# Patient Record
Sex: Female | Born: 1935 | Race: White | Hispanic: No | Marital: Single | Smoking: Never smoker
Health system: Southern US, Community
[De-identification: ages and names within clinical notes are randomized; demographics above are authoritative.]

## PROBLEM LIST (undated history)

## (undated) DIAGNOSIS — F0390 Unspecified dementia without behavioral disturbance: Secondary | ICD-10-CM

## (undated) DIAGNOSIS — E222 Syndrome of inappropriate secretion of antidiuretic hormone: Secondary | ICD-10-CM

## (undated) DIAGNOSIS — G459 Transient cerebral ischemic attack, unspecified: Secondary | ICD-10-CM

## (undated) DIAGNOSIS — E785 Hyperlipidemia, unspecified: Secondary | ICD-10-CM

## (undated) DIAGNOSIS — I4891 Unspecified atrial fibrillation: Secondary | ICD-10-CM

## (undated) DIAGNOSIS — E119 Type 2 diabetes mellitus without complications: Secondary | ICD-10-CM

## (undated) HISTORY — PX: CATARACT EXTRACTION: SUR2

## (undated) HISTORY — PX: CHOLECYSTECTOMY: SHX55

---

## 2010-09-08 DIAGNOSIS — E222 Syndrome of inappropriate secretion of antidiuretic hormone: Secondary | ICD-10-CM

## 2010-09-08 HISTORY — DX: Syndrome of inappropriate secretion of antidiuretic hormone: E22.2

## 2010-09-15 ENCOUNTER — Inpatient Hospital Stay: Payer: Self-pay | Admitting: Internal Medicine

## 2016-10-03 ENCOUNTER — Emergency Department
Admission: EM | Admit: 2016-10-03 | Discharge: 2016-10-03 | Disposition: A | Discharge status: Home / Self Care | Payer: Medicare Other | Attending: Emergency Medicine | Admitting: Emergency Medicine

## 2016-10-03 ENCOUNTER — Encounter: Payer: Self-pay | Admitting: Emergency Medicine

## 2016-10-03 DIAGNOSIS — Z7984 Long term (current) use of oral hypoglycemic drugs: Secondary | ICD-10-CM | POA: Diagnosis not present

## 2016-10-03 DIAGNOSIS — R55 Syncope and collapse: Secondary | ICD-10-CM | POA: Insufficient documentation

## 2016-10-03 DIAGNOSIS — E119 Type 2 diabetes mellitus without complications: Secondary | ICD-10-CM | POA: Diagnosis not present

## 2016-10-03 DIAGNOSIS — N390 Urinary tract infection, site not specified: Secondary | ICD-10-CM | POA: Diagnosis not present

## 2016-10-03 HISTORY — DX: Transient cerebral ischemic attack, unspecified: G45.9

## 2016-10-03 HISTORY — DX: Unspecified atrial fibrillation: I48.91

## 2016-10-03 HISTORY — DX: Unspecified dementia, unspecified severity, without behavioral disturbance, psychotic disturbance, mood disturbance, and anxiety: F03.90

## 2016-10-03 HISTORY — DX: Type 2 diabetes mellitus without complications: E11.9

## 2016-10-03 HISTORY — DX: Hyperlipidemia, unspecified: E78.5

## 2016-10-03 LAB — BASIC METABOLIC PANEL
ANION GAP: 5 (ref 5–15)
BUN: 16 mg/dL (ref 6–20)
CO2: 27 mmol/L (ref 22–32)
Calcium: 8.9 mg/dL (ref 8.9–10.3)
Chloride: 101 mmol/L (ref 101–111)
Creatinine, Ser: 0.97 mg/dL (ref 0.44–1.00)
GFR calc Af Amer: 57 mL/min — ABNORMAL LOW (ref >60)
GFR calc non Af Amer: 49 mL/min — ABNORMAL LOW (ref >60)
GLUCOSE: 236 mg/dL — AB (ref 65–99)
Potassium: 5.4 mmol/L — ABNORMAL HIGH (ref 3.5–5.1)
Sodium: 133 mmol/L — ABNORMAL LOW (ref 135–145)

## 2016-10-03 LAB — CBC
HCT: 39.1 % (ref 35.0–47.0)
HEMOGLOBIN: 13.4 g/dL (ref 12.0–16.0)
MCH: 30.4 pg (ref 26.0–34.0)
MCHC: 34.2 g/dL (ref 32.0–36.0)
MCV: 89 fL (ref 80.0–100.0)
Platelets: 173 10*3/uL (ref 150–440)
RBC: 4.4 MIL/uL (ref 3.80–5.20)
RDW: 14.3 % (ref 11.5–14.5)
WBC: 7.8 10*3/uL (ref 3.6–11.0)

## 2016-10-03 LAB — URINALYSIS, COMPLETE (UACMP) WITH MICROSCOPIC
BILIRUBIN URINE: NEGATIVE
Glucose, UA: 50 mg/dL — AB
KETONES UR: NEGATIVE mg/dL
Nitrite: NEGATIVE
Protein, ur: NEGATIVE mg/dL
Specific Gravity, Urine: 1.01 (ref 1.005–1.030)
pH: 6 (ref 5.0–8.0)

## 2016-10-03 LAB — TROPONIN I: Troponin I: <0.03 ng/mL (ref <0.03)

## 2016-10-03 LAB — GLUCOSE, CAPILLARY: GLUCOSE-CAPILLARY: 223 mg/dL — AB (ref 65–99)

## 2016-10-03 MED ORDER — CEPHALEXIN 500 MG PO CAPS: 500.0000 mg | ORAL_CAPSULE | Freq: Two times a day (BID) | ORAL | 0 refills | Status: DC

## 2016-10-03 MED ORDER — SODIUM CHLORIDE 0.9 % IV BOLUS (SEPSIS)
1000.0000 mL | Freq: Once | INTRAVENOUS | Status: AC
  Administered 2016-10-03: 1000 mL via INTRAVENOUS

## 2016-10-03 MED ORDER — CEFTRIAXONE SODIUM-DEXTROSE 1-3.74 GM-% IV SOLR
1.0000 g | Freq: Once | INTRAVENOUS | Status: AC
  Administered 2016-10-03: 1 g via INTRAVENOUS
  Filled 2016-10-03: qty 50

## 2016-10-03 MED ORDER — CEFTRIAXONE SODIUM 1 G IJ SOLR: 1.0000 g | Freq: Once | INTRAMUSCULAR | Status: DC

## 2016-10-03 NOTE — Discharge Instructions (Signed)
Please drink plenty of fluids, take your antibiotic as prescribed for her to entire duration. Return to the emergency department for any further syncopal episodes ((passing out), fever, or any other symptom personally concerning to yourself.

## 2016-10-03 NOTE — ED Triage Notes (Signed)
[facility] for near syncopal episode. Per EMS patient stood up and became cool, pale, and clammy. Fire department reported blood pressure of 70/pal on their arrival. EMS reports blood pressures in the 120's-130's systolic. Patient currently in A. Fib with hx/o the same.

## 2016-10-03 NOTE — ED Notes (Signed)
Patient grandson states that patient was using the rest room, she became pale and diaphoretic. Patients lips turned blue, patient was breathing at the time. Blood pressure was 70/pal and CBG was 250. Patient was unresponsive for about 3-4 minutes. When patient started coming around she was very weak and kept saying that she did not feel well. Patient was unable to remember what happened. Patient has hx/o dementia.

## 2016-10-03 NOTE — ED Notes (Signed)
Assisted pt to ambulate to bathroom. Pt urinated and had lg bm.

## 2016-10-03 NOTE — ED Provider Notes (Signed)
[facility] Emergency Department Provider Note  Time seen: 10:50 AM  I have reviewed the triage vital signs and the nursing notes.   HISTORY  Chief Complaint Near Syncope    HPI Dana Griffith is a 83 y.o. female with a past medical history of atrial fibrillation, dementia, diabetes, hyperlipidemia, TIAs, who presents to the emergency department after a syncopal event. According to the grandson who is a paramedic, the patient was calling for him (patient lives at home with grandson). He went upstairs and found the patient to be diaphoretic on the toilet with blue lips. He helped lay her down, states she was not responsive for approximately 3 minutes. Then came back around and is now acting completely normal. He states the patient was on the toilet but it did not appear that she had used the bathroom. Patient has significant dementia and does not recall the event, cannot contribute to her history. She has no complaints at this time and is well-appearing.  Past Medical History:  Diagnosis Date  . Atrial fibrillation (HCC)   . Dementia   . Diabetes mellitus without complication (HCC)   . Hyperlipemia   . TIA (transient ischemic attack)     There are no active problems to display for this patient.   History reviewed. No pertinent surgical history.  Prior to Admission medications   Not on File    No Known Allergies  No family history on file.  Social History Social History  Substance Use Topics  . Smoking status: Never Smoker  . Smokeless tobacco: Never Used  . Alcohol use No    Review of Systems Constitutional: Negative for fever. Cardiovascular: Negative for chest pain. Respiratory: Negative for shortness of breath. Gastrointestinal: Negative for abdominal pain Genitourinary: Negative for dysuria.  Son states the patient urinates very frequently. Neurological: Negative for headache 10-point ROS otherwise  negative.  ____________________________________________   PHYSICAL EXAM:  VITAL SIGNS: ED Triage Vitals [10/03/16 1004]  Enc Vitals Group     BP 127/68     Pulse Rate 82     Resp 16     Temp 97.6 F (36.4 C)     Temp Source Oral     SpO2 98 %     Weight      Height      Head Circumference      Peak Flow      Pain Score      Pain Loc      Pain Edu?      Excl. in GC?     Constitutional: Alert. Well appearing and in no distress.Acting normal per grandson. Eyes: Normal exam ENT   Head: Normocephalic and atraumatic.   Mouth/Throat: Mucous membranes are moist. Cardiovascular: Normal rate, regular rhythm. No murmur Respiratory: Normal respiratory effort without tachypnea nor retractions. Breath sounds are clear  Gastrointestinal: Soft and nontender. No distention.  Musculoskeletal: Nontender with normal range of motion in all extremities. No lower extremity tenderness or edema. Neurologic:  Normal speech and language. No gross focal neurologic deficits. Moves all extremities. Equal grip strengths. No facial droop or cranial nerve deficit. Skin:  Skin is warm, dry and intact.  Psychiatric: Mood and affect are normal.   ____________________________________________    EKG  EKG reviewed and interpreted by myself shows atrial fibrillation at 80 bpm. Borderline widened QRS, normal axis, normal intervals with no concerning ST changes.  ____________________________________________     INITIAL IMPRESSION / ASSESSMENT AND PLAN / ED COURSE  Pertinent  labs & imaging results that were available during my care of the patient were reviewed by me and considered in my medical decision making (see chart for details).  Patient presents to the emergency department after a syncopal episode while on the toilet. Grandson states diaphoresis and unresponsiveness. Patient came back around was noted to have a blood pressure in the 70s per grandson, both on the patient arrives to the  emergency department she has a normal blood pressure, she is alert and acting herself. Has no complaints at this time. EKG shows no concerning findings. We will check labs and closely monitor in the emergency department.  Patient's labs consistent with urinary tract infection, otherwise nonrevealing. Normal white blood cell count. I discussed the plan of care with family and they are agreeable. We'll discharge with Keflex. A urine culture has been sent. ____________________________________________   FINAL CLINICAL IMPRESSION(S) / ED DIAGNOSES  Syncope Urinary tract infection   Minna AntisKevin Cross Jorge, MD 10/03/16 1432

## 2016-10-05 LAB — URINE CULTURE

## 2017-06-01 ENCOUNTER — Emergency Department: Payer: Medicare Other

## 2017-06-01 ENCOUNTER — Encounter: Payer: Self-pay | Admitting: Emergency Medicine

## 2017-06-01 ENCOUNTER — Emergency Department
Admission: EM | Admit: 2017-06-01 | Discharge: 2017-06-01 | Disposition: A | Discharge status: Home / Self Care | Payer: Medicare Other | Attending: Student in an Organized Health Care Education/Training Program | Admitting: Student in an Organized Health Care Education/Training Program

## 2017-06-01 DIAGNOSIS — W07XXXA Fall from chair, initial encounter: Secondary | ICD-10-CM | POA: Diagnosis not present

## 2017-06-01 DIAGNOSIS — Y939 Activity, unspecified: Secondary | ICD-10-CM | POA: Insufficient documentation

## 2017-06-01 DIAGNOSIS — Z79899 Other long term (current) drug therapy: Secondary | ICD-10-CM | POA: Diagnosis not present

## 2017-06-01 DIAGNOSIS — Z794 Long term (current) use of insulin: Secondary | ICD-10-CM | POA: Diagnosis not present

## 2017-06-01 DIAGNOSIS — E119 Type 2 diabetes mellitus without complications: Secondary | ICD-10-CM | POA: Insufficient documentation

## 2017-06-01 DIAGNOSIS — F0390 Unspecified dementia without behavioral disturbance: Secondary | ICD-10-CM | POA: Insufficient documentation

## 2017-06-01 DIAGNOSIS — Y999 Unspecified external cause status: Secondary | ICD-10-CM | POA: Diagnosis not present

## 2017-06-01 DIAGNOSIS — S52592A Other fractures of lower end of left radius, initial encounter for closed fracture: Secondary | ICD-10-CM | POA: Diagnosis not present

## 2017-06-01 DIAGNOSIS — Y929 Unspecified place or not applicable: Secondary | ICD-10-CM | POA: Insufficient documentation

## 2017-06-01 DIAGNOSIS — Z8673 Personal history of transient ischemic attack (TIA), and cerebral infarction without residual deficits: Secondary | ICD-10-CM | POA: Insufficient documentation

## 2017-06-01 DIAGNOSIS — S6992XA Unspecified injury of left wrist, hand and finger(s), initial encounter: Secondary | ICD-10-CM | POA: Diagnosis present

## 2017-06-01 MED ORDER — HYDROCODONE-ACETAMINOPHEN 5-325 MG PO TABS: 1.0000 | ORAL_TABLET | Freq: Four times a day (QID) | ORAL | 0 refills | Status: AC | PRN

## 2017-06-01 NOTE — ED Provider Notes (Signed)
[facility] Emergency Department Provider Note  ____________________________________________  Time seen: Approximately 3:39 PM  I have reviewed the triage vital signs and the nursing notes.   HISTORY  Chief Complaint Wrist Pain  Patient has dementia and daughter is guardian and present with patient. Majority of history is provided by daughter  HPI CATLYNN Griffith is a 83 y.o. female who presents to emergency department with her daughter for complaint of left wrist injury. Per the daughter, the patient was attempting to sit in a chair when she slipped and landed on left wrist. Patient has been complaining of pain and swelling 2 days. Initially, family thought patient sprained wrist with concern as worse as been edematous, with ecchymosis. No other injury or complaint.patient did not hit head or lose consciousness during the fall. Patient has been acting her baseline since injury. Patient reports that pain is relieved with Tylenol. No other injury or complaint at this time.   Past Medical History:  Diagnosis Date  . Atrial fibrillation (HCC)   . Dementia   . Diabetes mellitus without complication (HCC)   . Hyperlipemia   . TIA (transient ischemic attack)     There are no active problems to display for this patient.   History reviewed. No pertinent surgical history.  Prior to Admission medications   Medication Sig Start Date End Date Taking? Authorizing Provider  atorvastatin (LIPITOR) 20 MG tablet Take 20 mg by mouth daily. 09/24/16   [provider]  cephALEXin (KEFLEX) 500 MG capsule Take 1 capsule (500 mg total) by mouth 2 (two) times daily. 10/03/16   Minna Antis, MD  digoxin (LANOXIN) 0.125 MG tablet Take 125 mcg by mouth daily. 09/23/16   [provider]  diltiazem (CARDIZEM SR) 60 MG 12 hr capsule Take 60 mg by mouth 2 (two) times daily. 09/23/16   [provider]  HUMULIN R 100 UNIT/ML injection Inject 8 Units into  the skin 3 (three) times daily. 09/28/16   [provider]  HYDROcodone-acetaminophen (NORCO/VICODIN) 5-325 MG tablet Take 1 tablet by mouth every 6 (six) hours as needed for severe pain. 06/01/17   Latasha Puskas, Delorise Royals, PA-C  LANTUS SOLOSTAR 100 UNIT/ML Solostar Pen Inject 30 Units into the skin at bedtime. 09/19/16   [provider]  lisinopril (PRINIVIL,ZESTRIL) 10 MG tablet Take 10 mg by mouth daily. 09/23/16   [provider]  metoCLOPramide (REGLAN) 5 MG tablet Take 1 tablet by mouth 3 (three) times daily after meals. 09/20/16   [provider]  nitroGLYCERIN (NITROSTAT) 0.4 MG SL tablet Place 1 tablet under the tongue every 5 (five) minutes x 3 doses as needed. 09/24/16   [provider]  ondansetron (ZOFRAN-ODT) 4 MG disintegrating tablet Take 4 mg by mouth every 6 (six) hours. 09/27/16   [provider]    Allergies Niaspan [niacin er]  No family history on file.  Social History Social History  Substance Use Topics  . Smoking status: Never Smoker  . Smokeless tobacco: Never Used  . Alcohol use No     Review of Systems  Constitutional: No fever/chills Eyes: No visual changes.  Cardiovascular: no chest pain. Respiratory: no cough. No SOB. Gastrointestinal: No abdominal pain.  No nausea, no vomiting.   Musculoskeletal: positive for left wrist injury. Skin: Negative for rash, abrasions, lacerations, ecchymosis. Neurological: Negative for headaches, focal weakness or numbness. 10-point ROS otherwise negative.  ____________________________________________   PHYSICAL EXAM:  VITAL SIGNS: ED Triage Vitals [06/01/17 1438]  Enc Vitals  Group     BP (!) 102/57     Pulse Rate 99     Resp 18     Temp 98.3 F (36.8 C)     Temp Source Oral     SpO2 94 %     Weight 105 lb (47.6 kg)     Height  (1.6 m)     Head Circumference      Peak Flow      Pain Score      Pain Loc      Pain Edu?      Excl. in GC?       Constitutional: Alert and oriented. Well appearing and in no acute distress. Eyes: Conjunctivae are normal. PERRL. EOMI. Head: Atraumatic. ENT:      Ears:       Nose: No congestion/rhinnorhea.      Mouth/Throat: Mucous membranes are moist.  Neck: No stridor.  No cervical spine tenderness to palpation.  Cardiovascular: Normal rate, regular rhythm. Normal S1 and S2.  Good peripheral circulation. Respiratory: Normal respiratory effort without tachypnea or retractions. Lungs CTAB. Good air entry to the bases with no decreased or absent breath sounds. Musculoskeletal: Full range of motion to all extremities. No gross deformities appreciated.no deformity noted to left wrist. 2. Ecchymosis and edema is noted of the distal radius and ulna. Patient is tender to palpation of the distal radius with no palpable abnormality. Good range of motion to the wrist and all digits left hand. Cap refill and sensation intact all 5 digits. Examination of the elbow is unremarkable. Neurologic:  Normal speech and language. No gross focal neurologic deficits are appreciated.  Skin:  Skin is warm, dry and intact. No rash noted. Psychiatric: Mood and affect are normal. Speech and behavior are normal. Patient exhibits appropriate insight and judgement.   ____________________________________________   LABS (all labs ordered are listed, but only abnormal results are displayed)  Labs Reviewed - No data to display ____________________________________________  EKG   ____________________________________________  RADIOLOGY Festus [place], personally viewed and evaluated these images (plain radiographs) as part of my medical decision making, as well as reviewing the written report by the radiologist.  Dg Wrist Complete Left  Result Date: 06/01/2017 CLINICAL DATA:  83 year old who was found the sleep on the floor next to her rocking chair 2 days ago. Erythema and swelling involving the left wrist. No  known injury. EXAM: LEFT WRIST - COMPLETE 3+ VIEW COMPARISON:  None. FINDINGS: Acute impacted fracture involving the distal radius with extension to the articular surface. Anatomic alignment of the radiocarpal joint. Joint spaces well-preserved within the carpus. Severe narrowing of the first MTP joint space with associated hypertrophic spurring. Osseous demineralization. IMPRESSION: 1. Acute impacted intra-articular fracture involving the distal radius. 2. 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 Carpal joint spaces are well preserved. 3. Osseous demineralization. Electronically Signed   By: Hulan Saas M.D.   On: 06/01/2017 15:26    ____________________________________________    PROCEDURES  Procedure(s) performed:    .Splint Application Date/Time: 06/01/2017 4:28 PM Performed by: Gala Romney D Authorized by: Gala Romney D   Consent:    Consent obtained:  Verbal   Consent given by:  Patient and guardian   Risks discussed:  Pain and swelling Pre-procedure details:    Sensation:  Normal Procedure details:    Laterality:  Left   Location:  Wrist   Wrist:  L wrist   Splint type:  Sugar tong   Supplies:  Cotton padding, Ortho-Glass, elastic bandage and sling Post-procedure details:    Pain:  Improved   Sensation:  Normal   Patient tolerance of procedure:  Tolerated well, no immediate complications      Medications - No data to display   ____________________________________________   INITIAL IMPRESSION / ASSESSMENT AND PLAN / ED COURSE  Pertinent labs & imaging results that were available during my care of the patient were reviewed by me and considered in my medical decision making (see chart for details).  Review of the Standard CSRS was performed in accordance of the [facility] prior to dispensing any controlled drugs.     Patient's diagnosis is consistent with left distal radius fracture. Patient presented status post a fall onto the left wrist  with pain and swelling. X-ray reveals impacted distal radius fracture. Patient is neurovascularly intact. Wrist is splinted as described above. Limit pain medication as prescribed and to be taken only with daughter present. Patient will follow-up with orthopedics for further management..  Patient is given ED precautions to return to the ED for any worsening or new symptoms.     ____________________________________________  FINAL CLINICAL IMPRESSION(S) / ED DIAGNOSES  Final diagnoses:  Other closed fracture of distal end of left radius, initial encounter      NEW MEDICATIONS STARTED DURING THIS VISIT:  New Prescriptions   HYDROCODONE-ACETAMINOPHEN (NORCO/VICODIN) 5-325 MG TABLET    Take 1 tablet by mouth every 6 (six) hours as needed for severe pain.        This chart was dictated using voice recognition software/Dragon. Despite best efforts to proofread, errors can occur which can change the meaning. Any change was purely unintentional.    Racheal Patches, PA-C 06/01/17 1629    Merrily Brittle, MD 06/01/17 [phone]

## 2017-06-01 NOTE — ED Triage Notes (Signed)
Patient comes is with daughter who states she had an unwitnessed fall 2 days ago. Redness L wrist. Daughter states it looked like she had tried to sit on chair and chair moved out from under her. Patient has dementia and per her daughter is her normal self. Patient is verbal. Denies pain when questioned. Daughter states checked scalp after fall and not swollen areas indicating had hit head.

## 2017-07-17 ENCOUNTER — Other Ambulatory Visit: Payer: Self-pay

## 2017-07-17 ENCOUNTER — Encounter: Payer: Self-pay | Admitting: Emergency Medicine

## 2017-07-17 ENCOUNTER — Emergency Department: Payer: Medicare Other

## 2017-07-17 ENCOUNTER — Emergency Department
Admission: EM | Admit: 2017-07-17 | Discharge: 2017-07-17 | Disposition: A | Discharge status: Still In Hospital | Payer: Medicare Other

## 2017-07-17 ENCOUNTER — Emergency Department
Admission: EM | Admit: 2017-07-17 | Discharge: 2017-07-17 | Disposition: A | Discharge status: Home / Self Care | Payer: Medicare Other | Attending: Student in an Organized Health Care Education/Training Program | Admitting: Student in an Organized Health Care Education/Training Program

## 2017-07-17 DIAGNOSIS — N3000 Acute cystitis without hematuria: Secondary | ICD-10-CM | POA: Insufficient documentation

## 2017-07-17 DIAGNOSIS — Z794 Long term (current) use of insulin: Secondary | ICD-10-CM | POA: Insufficient documentation

## 2017-07-17 DIAGNOSIS — R55 Syncope and collapse: Secondary | ICD-10-CM | POA: Diagnosis present

## 2017-07-17 DIAGNOSIS — F0390 Unspecified dementia without behavioral disturbance: Secondary | ICD-10-CM | POA: Diagnosis not present

## 2017-07-17 DIAGNOSIS — Z7982 Long term (current) use of aspirin: Secondary | ICD-10-CM | POA: Diagnosis not present

## 2017-07-17 DIAGNOSIS — I10 Essential (primary) hypertension: Secondary | ICD-10-CM | POA: Diagnosis not present

## 2017-07-17 DIAGNOSIS — Z79899 Other long term (current) drug therapy: Secondary | ICD-10-CM | POA: Diagnosis not present

## 2017-07-17 DIAGNOSIS — E119 Type 2 diabetes mellitus without complications: Secondary | ICD-10-CM | POA: Insufficient documentation

## 2017-07-17 LAB — URINALYSIS, COMPLETE (UACMP) WITH MICROSCOPIC
Bilirubin Urine: NEGATIVE
GLUCOSE, UA: NEGATIVE mg/dL
HGB URINE DIPSTICK: NEGATIVE
KETONES UR: NEGATIVE mg/dL
NITRITE: POSITIVE — AB
PH: 6 (ref 5.0–8.0)
PROTEIN: NEGATIVE mg/dL
SQUAMOUS EPITHELIAL / LPF: NONE SEEN
Specific Gravity, Urine: 1.01 (ref 1.005–1.030)

## 2017-07-17 LAB — BASIC METABOLIC PANEL
Anion gap: 11 (ref 5–15)
BUN: 13 mg/dL (ref 6–20)
CO2: 24 mmol/L (ref 22–32)
Calcium: 8.7 mg/dL — ABNORMAL LOW (ref 8.9–10.3)
Chloride: 95 mmol/L — ABNORMAL LOW (ref 101–111)
Creatinine, Ser: 0.88 mg/dL (ref 0.44–1.00)
GFR calc Af Amer: >60 mL/min (ref >60)
GFR, EST NON AFRICAN AMERICAN: 55 mL/min — AB (ref >60)
GLUCOSE: 233 mg/dL — AB (ref 65–99)
Potassium: 5 mmol/L (ref 3.5–5.1)
SODIUM: 130 mmol/L — AB (ref 135–145)

## 2017-07-17 LAB — TROPONIN I: Troponin I: <0.03 ng/mL (ref <0.03)

## 2017-07-17 LAB — CBC
HCT: 39.5 % (ref 35.0–47.0)
Hemoglobin: 13.1 g/dL (ref 12.0–16.0)
MCH: 30.3 pg (ref 26.0–34.0)
MCHC: 33.2 g/dL (ref 32.0–36.0)
MCV: 91.3 fL (ref 80.0–100.0)
PLATELETS: 163 10*3/uL (ref 150–440)
RBC: 4.32 MIL/uL (ref 3.80–5.20)
RDW: 13.5 % (ref 11.5–14.5)
WBC: 11.7 10*3/uL — AB (ref 3.6–11.0)

## 2017-07-17 LAB — GLUCOSE, CAPILLARY: GLUCOSE-CAPILLARY: 236 mg/dL — AB (ref 65–99)

## 2017-07-17 MED ORDER — CEPHALEXIN 500 MG PO CAPS
500.0000 mg | ORAL_CAPSULE | Freq: Once | ORAL | Status: AC
Start: 2017-07-17 — End: 2017-07-17
  Administered 2017-07-17: 500 mg via ORAL
  Filled 2017-07-17: qty 1

## 2017-07-17 MED ORDER — DILTIAZEM HCL 30 MG PO TABS
30.0000 mg | ORAL_TABLET | Freq: Four times a day (QID) | ORAL | Status: DC
  Administered 2017-07-17: 30 mg via ORAL
  Filled 2017-07-17 (×3): qty 1

## 2017-07-17 MED ORDER — DILTIAZEM HCL ER 60 MG PO CP12
60.0000 mg | ORAL_CAPSULE | Freq: Two times a day (BID) | ORAL | Status: DC
  Filled 2017-07-17: qty 1

## 2017-07-17 MED ORDER — CEPHALEXIN 500 MG PO CAPS: 500.0000 mg | ORAL_CAPSULE | Freq: Three times a day (TID) | ORAL | 0 refills | Status: AC

## 2017-07-17 MED ORDER — SODIUM CHLORIDE 0.9 % IV BOLUS (SEPSIS)
500.0000 mL | Freq: Once | INTRAVENOUS | Status: AC
  Administered 2017-07-17: 500 mL via INTRAVENOUS

## 2017-07-17 NOTE — Progress Notes (Signed)
LCSW met with patient and her daughter who is her legal guardian. Patient has excellent family support and will return to family home. Several family members rally around this patient to provided excellent in home support. LCSW offered dementia services  brochures but her daughter stated she is very well connected already. She did request a  diet coke and LCSW got it for her she had no further questions and LCSW collected information to complete brief assessment. EDP consulted and LCSW explained the great family involvement to see to her care at home.  No further needs  [employer] LCSW [phone]

## 2017-07-17 NOTE — ED Notes (Signed)
Pt family declines for pt to have in and out cath at this time.

## 2017-07-17 NOTE — ED Triage Notes (Addendum)
Pt to ED via [facility] from home for syncopal episode after having large BM and taking a shower. Pt daughter states that she has had episodes like this in the past. Per EMS pt was hypotensive on their arrival with a systolic pressure in the 80's. Pt in NAD at this time.

## 2017-07-17 NOTE — ED Provider Notes (Signed)
[facility] Emergency Department Provider Note    First MD Initiated Contact with Patient 07/17/17 1536     (approximate)  I have reviewed the triage vital signs and the nursing notes.   HISTORY  Chief Complaint Loss of Consciousness    HPI Dana Griffith is a 83 y.o. female with a history of atrial fibrillation as well as TIA, diabetes and history of recurrent vasovagal syncopal episodes presents after witnessed syncopal episode.  Patient was at daughter's home and went to use the bathroom.  Had a bowel movement and when the daughter went to check on her she noted that the patient started looking like she was about to pass out and become unresponsive.  She did not fall and hit her head.  She was laid to the ground.  Her remained lightheaded and less responsive for several months minutes.  When EMS came to assess her she was profoundly hypotensive which slowly improved without any IV fluids.  Daughter states that she is get frequent urinary tract infections.  Has not been complaining of any abdominal pain, nausea, chest pain, shortness of breath, fevers, discomfort.  Past Medical History:  Diagnosis Date  . Atrial fibrillation (HCC)   . Dementia   . Diabetes mellitus without complication (HCC)   . Hyperlipemia   . TIA (transient ischemic attack)    No family history on file. History reviewed. No pertinent surgical history. There are no active problems to display for this patient.     Prior to Admission medications   Medication Sig Start Date End Date Taking? Authorizing Provider  aspirin EC 81 MG tablet Take 81 mg 2 (two) times daily by mouth.   Yes [provider]  atorvastatin (LIPITOR) 20 MG tablet Take 20 mg by mouth daily. 09/24/16  Yes [provider]  Cholecalciferol (VITAMIN D3) 2000 units TABS Take 1 tablet daily by mouth.   Yes [provider]  digoxin (LANOXIN) 0.125 MG tablet Take 125 mcg by mouth daily. 09/23/16   Yes [provider]  diltiazem (CARDIZEM SR) 60 MG 12 hr capsule Take 60 mg by mouth 2 (two) times daily. 09/23/16  Yes [provider]  escitalopram (LEXAPRO) 10 MG tablet Take 10 mg daily by mouth.   Yes [provider]  fluticasone (FLONASE) 50 MCG/ACT nasal spray Place 1 spray 2 (two) times daily into both nostrils.   Yes [provider]  HUMULIN R 100 UNIT/ML injection Inject 8 Units 4 (four) times daily as needed into the skin.  09/28/16  Yes [provider]  isosorbide mononitrate (IMDUR) 30 MG 24 hr tablet Take 30 mg daily by mouth.   Yes [provider]  lisinopril (PRINIVIL,ZESTRIL) 10 MG tablet Take 10 mg by mouth daily. 09/23/16  Yes [provider]  metoCLOPramide (REGLAN) 5 MG tablet Take 1 tablet 2 (two) times daily by mouth.  09/20/16  Yes [provider]  Multiple Vitamin (MULTIVITAMIN) tablet Take 1 tablet daily by mouth.   Yes [provider]  omeprazole (PRILOSEC) 40 MG capsule Take 40 mg daily by mouth.   Yes [provider]  sodium chloride 1 g tablet Take 1 g 2 (two) times daily with a meal by mouth.   Yes [provider]  cephALEXin (KEFLEX) 500 MG capsule Take 1 capsule (500 mg total) by mouth 2 (two) times daily. Patient not taking: Reported on 07/17/2017 10/03/16   Minna AntisPaduchowski, Kevin, MD  cephALEXin (KEFLEX) 500 MG capsule Take 1  capsule (500 mg total) 3 (three) times daily for 7 days by mouth. 07/17/17 07/24/17  Willy Eddy, MD  HYDROcodone-acetaminophen (NORCO/VICODIN) 5-325 MG tablet Take 1 tablet by mouth every 6 (six) hours as needed for severe pain. 06/01/17   Cuthriell, Delorise Royals, PA-C  nitroGLYCERIN (NITROSTAT) 0.4 MG SL tablet Place 1 tablet under the tongue every 5 (five) minutes x 3 doses as needed. 09/24/16   [provider]  ondansetron (ZOFRAN-ODT) 4 MG disintegrating tablet Take 4 mg by mouth every 6 (six) hours. 09/27/16   [provider]     Allergies Niaspan [niacin er]    Social History Social History   Tobacco Use  . Smoking status: Never Smoker  . Smokeless tobacco: Never Used  Substance Use Topics  . Alcohol use: No  . Drug use: Not on file    Review of Systems Patient denies headaches, rhinorrhea, blurry vision, numbness, shortness of breath, chest pain, edema, cough, abdominal pain, nausea, vomiting, diarrhea, dysuria, fevers, rashes or hallucinations unless otherwise stated above in HPI. ____________________________________________   PHYSICAL EXAM:  VITAL SIGNS: Vitals:   07/17/17 1930 07/17/17 1940  BP: 111/67   Pulse:    Resp:  18  Temp:    SpO2:      Constitutional: Alert. Frail and elderly appearing but in no acute distress. Eyes: Conjunctivae are normal.  Head: Atraumatic. Nose: No congestion/rhinnorhea. Mouth/Throat: Mucous membranes are moist.   Neck: No stridor. Painless ROM.  Cardiovascular: irregularly irregular rhythm. Grossly normal heart sounds.  Good peripheral circulation. Respiratory: Normal respiratory effort.  No retractions. Lungs CTAB. Gastrointestinal: Soft and nontender. No distention. No abdominal bruits. No CVA tenderness. Genitourinary:  Musculoskeletal: No lower extremity tenderness nor edema.  No joint effusions. Neurologic:  Normal speech and language. No gross focal neurologic deficits are appreciated. No facial droop Skin:  Skin is warm, dry and intact. No rash noted. Psychiatric: Mood and affect are normal. Speech and behavior are normal.  ____________________________________________   LABS (all labs ordered are listed, but only abnormal results are displayed)  Results for orders placed or performed during the hospital encounter of 07/17/17 (from the past 24 hour(s))  Basic metabolic panel     Status: Abnormal   Collection Time: 07/17/17  3:47 PM  Result Value Ref Range   Sodium 130 (L) 135 - 145 mmol/L   Potassium 5.0 3.5 - 5.1 mmol/L   Chloride 95  (L) 101 - 111 mmol/L   CO2 24 22 - 32 mmol/L   Glucose, Bld 233 (H) 65 - 99 mg/dL   BUN 13 6 - 20 mg/dL   Creatinine, Ser 1.61 0.44 - 1.00 mg/dL   Calcium 8.7 (L) 8.9 - 10.3 mg/dL   GFR calc non Af Amer 55 (L) >60 mL/min   GFR calc Af Amer >60 >60 mL/min   Anion gap 11 5 - 15  CBC     Status: Abnormal   Collection Time: 07/17/17  3:47 PM  Result Value Ref Range   WBC 11.7 (H) 3.6 - 11.0 K/uL   RBC 4.32 3.80 - 5.20 MIL/uL   Hemoglobin 13.1 12.0 - 16.0 g/dL   HCT 09.6 04.5 - 40.9 %   MCV 91.3 80.0 - 100.0 fL   MCH 30.3 26.0 - 34.0 pg   MCHC 33.2 32.0 - 36.0 g/dL   RDW 81.1 91.4 - 78.2 %   Platelets 163 150 - 440 K/uL  Troponin I     Status: None   Collection Time: 07/17/17  3:47 PM  Result Value Ref Range   Troponin I <0.03 <0.03 ng/mL  Urinalysis, Complete w Microscopic     Status: Abnormal   Collection Time: 07/17/17  7:29 PM  Result Value Ref Range   Color, Urine YELLOW (A) YELLOW   APPearance HAZY (A) CLEAR   Specific Gravity, Urine 1.010 1.005 - 1.030   pH 6.0 5.0 - 8.0   Glucose, UA NEGATIVE NEGATIVE mg/dL   Hgb urine dipstick NEGATIVE NEGATIVE   Bilirubin Urine NEGATIVE NEGATIVE   Ketones, ur NEGATIVE NEGATIVE mg/dL   Protein, ur NEGATIVE NEGATIVE mg/dL   Nitrite POSITIVE (A) NEGATIVE   Leukocytes, UA LARGE (A) NEGATIVE   RBC / HPF 0-5 0 - 5 RBC/hpf   WBC, UA TOO NUMEROUS TO COUNT 0 - 5 WBC/hpf   Bacteria, UA FEW (A) NONE SEEN   Squamous Epithelial / LPF NONE SEEN NONE SEEN   ____________________________________________  EKG My review and personal interpretation at Time: 15:37   Indication: syncope  Rate: 55  Rhythm afib rate controlled Axis: normal Other: no stemi, otherwise normal intervals ____________________________________________  RADIOLOGY  I personally reviewed all radiographic images ordered to evaluate for the above acute complaints and reviewed radiology reports and findings.  These findings were personally discussed with the patient.  Please see  medical record for radiology report.  ____________________________________________   PROCEDURES  Procedure(s) performed:  Procedures    Critical Care performed: no ____________________________________________   INITIAL IMPRESSION / ASSESSMENT AND PLAN / ED COURSE  Pertinent labs & imaging results that were available during my care of the patient were reviewed by me and considered in my medical decision making (see chart for details).  DDX: dysrhthmia, acs, pna, chf, vasovagal, uti, sepsis  Dana MansonMargaret H Griffith is a 83 y.o. who presents to the ED with near syncopal episode as described above.  Patient has history of the same.  Currently in A. fib but is currently well controlled.  No evidence of head injury by physical exam or history.  We will check blood work for the above differential.  Do suspect component of UTI she has had this in the past.  Will evaluate for any evidence of dysrhythmia by keeping the patient on a monitor her for ACS.  Have discussed with the patient and available family all diagnostics and treatments performed thus far and all questions were answered to the best of my ability. The patient demonstrates understanding and agreement with plan.   Clinical Course as of Jul 17 2334  Caleen EssexFri Jul 17, 2017  [id number] Patient remains well-appearing and in no acute distress.  Given gentle bolus of IV fluids with improvement in her blood pressure.  Currently awaiting urinalysis that she does have mild leukocytosis and she had similar episode of this previously related to UTI.  [PR]  1956 Patient reassessed.  Ambulating with a steady gait.  Discussed with patient's family who is a EMT and well-known to this facility who states that she is at her baseline.  He is a very reliable historian and does feel comfortable taking care of her at home.  They have dealt with this in the past including syncopal episodes and urinary tract infection.  Based on her age I did offer admission to the hospital  for IV antibiotics and observation overnight but they have prefer to take her home due to her dementia and concern for inciting agitation or worsening confusion.  As she does not have any fever evidence of sepsis I do believe that  this is reasonable.  Patient was given an oral dose of Keflex here in the ER and will be stable and appropriate for follow-up with PCP.  [PR]    Clinical Course User Index [PR] Willy Eddy, MD     ____________________________________________   FINAL CLINICAL IMPRESSION(S) / ED DIAGNOSES  Final diagnoses:  Near syncope  Acute cystitis without hematuria      NEW MEDICATIONS STARTED DURING THIS VISIT:  This SmartLink is deprecated. Use AVSMEDLIST instead to display the medication list for a patient.   Note:  This document was prepared using Dragon voice recognition software and may include unintentional dictation errors.    Willy Eddy, MD 07/17/17 [phone]

## 2017-07-17 NOTE — ED Notes (Addendum)
Attempted to get urine with hat. Pt had a BM in sample. MD notified. Pt ambulated with her baseline gait per family.

## 2017-07-17 NOTE — ED Triage Notes (Signed)
Pt to ED via [facility] from home for syncopal episode after having large BM and taking a shower. Pt daughter states that she has had episodes like this in the past. Per EMS pt was hypotensive on their arrival with a systolic pressure in the 80's. Pt in NAD at this time.  

## 2018-06-12 ENCOUNTER — Encounter: Payer: Self-pay | Admitting: Emergency Medicine

## 2018-06-12 ENCOUNTER — Inpatient Hospital Stay
Admission: EM | Admit: 2018-06-12 | Discharge: 2018-06-12 | DRG: 690 | Disposition: A | Discharge status: Home / Self Care | Payer: Medicare Other | Attending: Emergency Medicine | Admitting: Emergency Medicine

## 2018-06-12 ENCOUNTER — Other Ambulatory Visit: Payer: Self-pay

## 2018-06-12 DIAGNOSIS — F0390 Unspecified dementia without behavioral disturbance: Secondary | ICD-10-CM | POA: Diagnosis not present

## 2018-06-12 DIAGNOSIS — Z792 Long term (current) use of antibiotics: Secondary | ICD-10-CM | POA: Diagnosis not present

## 2018-06-12 DIAGNOSIS — E119 Type 2 diabetes mellitus without complications: Secondary | ICD-10-CM | POA: Diagnosis not present

## 2018-06-12 DIAGNOSIS — I4510 Unspecified right bundle-branch block: Secondary | ICD-10-CM | POA: Diagnosis present

## 2018-06-12 DIAGNOSIS — N3000 Acute cystitis without hematuria: Secondary | ICD-10-CM | POA: Diagnosis present

## 2018-06-12 DIAGNOSIS — R4182 Altered mental status, unspecified: Secondary | ICD-10-CM | POA: Diagnosis not present

## 2018-06-12 DIAGNOSIS — N289 Disorder of kidney and ureter, unspecified: Secondary | ICD-10-CM

## 2018-06-12 DIAGNOSIS — I4891 Unspecified atrial fibrillation: Secondary | ICD-10-CM | POA: Diagnosis present

## 2018-06-12 DIAGNOSIS — I959 Hypotension, unspecified: Secondary | ICD-10-CM | POA: Diagnosis present

## 2018-06-12 DIAGNOSIS — Z8673 Personal history of transient ischemic attack (TIA), and cerebral infarction without residual deficits: Secondary | ICD-10-CM | POA: Diagnosis not present

## 2018-06-12 LAB — CBC
HEMATOCRIT: 42.8 % (ref 35.0–47.0)
HEMOGLOBIN: 14.2 g/dL (ref 12.0–16.0)
MCH: 30.3 pg (ref 26.0–34.0)
MCHC: 33.2 g/dL (ref 32.0–36.0)
MCV: 91.2 fL (ref 80.0–100.0)
Platelets: 196 10*3/uL (ref 150–440)
RBC: 4.69 MIL/uL (ref 3.80–5.20)
RDW: 13.8 % (ref 11.5–14.5)
WBC: 10.5 10*3/uL (ref 3.6–11.0)

## 2018-06-12 LAB — URINALYSIS, COMPLETE (UACMP) WITH MICROSCOPIC
BILIRUBIN URINE: NEGATIVE
GLUCOSE, UA: NEGATIVE mg/dL
HGB URINE DIPSTICK: NEGATIVE
KETONES UR: NEGATIVE mg/dL
NITRITE: POSITIVE — AB
PH: 5 (ref 5.0–8.0)
PROTEIN: NEGATIVE mg/dL
Specific Gravity, Urine: 1.014 (ref 1.005–1.030)

## 2018-06-12 LAB — COMPREHENSIVE METABOLIC PANEL
ALBUMIN: 4.2 g/dL (ref 3.5–5.0)
ALT: 11 U/L (ref 0–44)
ANION GAP: 11 (ref 5–15)
AST: 29 U/L (ref 15–41)
Alkaline Phosphatase: 131 U/L — ABNORMAL HIGH (ref 38–126)
BILIRUBIN TOTAL: 1.1 mg/dL (ref 0.3–1.2)
BUN: 23 mg/dL (ref 8–23)
CALCIUM: 9.6 mg/dL (ref 8.9–10.3)
CO2: 26 mmol/L (ref 22–32)
Chloride: 100 mmol/L (ref 98–111)
Creatinine, Ser: 1.07 mg/dL — ABNORMAL HIGH (ref 0.44–1.00)
GFR calc non Af Amer: 43 mL/min — ABNORMAL LOW (ref >60)
GFR, EST AFRICAN AMERICAN: 50 mL/min — AB (ref >60)
GLUCOSE: 199 mg/dL — AB (ref 70–99)
POTASSIUM: 4.8 mmol/L (ref 3.5–5.1)
SODIUM: 137 mmol/L (ref 135–145)
TOTAL PROTEIN: 7.5 g/dL (ref 6.5–8.1)

## 2018-06-12 LAB — GLUCOSE, CAPILLARY: GLUCOSE-CAPILLARY: 187 mg/dL — AB (ref 70–99)

## 2018-06-12 MED ORDER — CEPHALEXIN 500 MG PO CAPS: 500.0000 mg | ORAL_CAPSULE | Freq: Four times a day (QID) | ORAL | 0 refills | Status: AC

## 2018-06-12 MED ORDER — DIGOXIN 125 MCG PO TABS: 125.0000 ug | ORAL_TABLET | Freq: Every day | ORAL | Status: DC

## 2018-06-12 MED ORDER — SODIUM CHLORIDE 0.9 % IV SOLN
INTRAVENOUS | Status: AC
  Administered 2018-06-12: 1 g via INTRAVENOUS
  Filled 2018-06-12: qty 10

## 2018-06-12 MED ORDER — NITROGLYCERIN 0.4 MG SL SUBL: 0.4000 mg | SUBLINGUAL_TABLET | SUBLINGUAL | Status: DC | PRN

## 2018-06-12 MED ORDER — SODIUM CHLORIDE 0.9 % IV SOLN
1.0000 g | INTRAVENOUS | Status: DC
  Administered 2018-06-12: 1 g via INTRAVENOUS

## 2018-06-12 MED ORDER — ONDANSETRON HCL 4 MG/2ML IJ SOLN: 4.0000 mg | Freq: Four times a day (QID) | INTRAMUSCULAR | Status: DC | PRN

## 2018-06-12 MED ORDER — INSULIN ASPART 100 UNIT/ML ~~LOC~~ SOLN: 0.0000 [IU] | Freq: Every day | SUBCUTANEOUS | Status: DC

## 2018-06-12 MED ORDER — SODIUM CHLORIDE 0.9 % IV SOLN
Freq: Once | INTRAVENOUS | Status: AC
Start: 2018-06-12 — End: 2018-06-12
  Administered 2018-06-12: 18:00:00 via INTRAVENOUS

## 2018-06-12 MED ORDER — ONDANSETRON HCL 4 MG PO TABS: 4.0000 mg | ORAL_TABLET | Freq: Four times a day (QID) | ORAL | Status: DC | PRN

## 2018-06-12 MED ORDER — SENNOSIDES-DOCUSATE SODIUM 8.6-50 MG PO TABS: 1.0000 | ORAL_TABLET | Freq: Every evening | ORAL | Status: DC | PRN

## 2018-06-12 MED ORDER — ENOXAPARIN SODIUM 40 MG/0.4ML ~~LOC~~ SOLN: 40.0000 mg | SUBCUTANEOUS | Status: DC

## 2018-06-12 MED ORDER — INSULIN ASPART 100 UNIT/ML ~~LOC~~ SOLN: 0.0000 [IU] | Freq: Three times a day (TID) | SUBCUTANEOUS | Status: DC

## 2018-06-12 MED ORDER — FLUTICASONE PROPIONATE 50 MCG/ACT NA SUSP: 1.0000 | Freq: Two times a day (BID) | NASAL | Status: DC

## 2018-06-12 MED ORDER — ESCITALOPRAM OXALATE 10 MG PO TABS: 10.0000 mg | ORAL_TABLET | Freq: Every day | ORAL | Status: DC

## 2018-06-12 MED ORDER — PANTOPRAZOLE SODIUM 40 MG PO TBEC: 40.0000 mg | DELAYED_RELEASE_TABLET | Freq: Every day | ORAL | Status: DC

## 2018-06-12 NOTE — ED Triage Notes (Signed)
Pt to ED via POV with family who states that pt has had decline in mental and health status the past few days. Pt has been hypotensive at home, pt is not getting up and able to do like she normally does. Pt BP in triage is 85/59, pt CBG at home 191.

## 2018-06-12 NOTE — ED Notes (Signed)
Pt ambulatory with assistance to toilet.

## 2018-06-12 NOTE — ED Notes (Signed)
Pt had hypotension today (80's/50's) and was unable to get up to bathroom (normally goes alone) - pt has been sleeping for 2 days - decreased appetite for 2 days - denies N/V/D Hx afib and this was noted on EMS monitor at pt home - regulated with medication

## 2018-06-12 NOTE — ED Notes (Signed)
Esign not working pt's caregiver verbalized discharge instructions and has no questions at this time.

## 2018-06-12 NOTE — Discharge Instructions (Signed)
Please make sure you drink plenty of fluid to stay well-hydrated and to help your urine infection pass.  Please take the entire course of antibiotics, even if you are feeling better.

## 2018-06-12 NOTE — ED Provider Notes (Signed)
[facility] Emergency Department Provider Note  ____________________________________________  Time seen: Approximately 7:16 PM  I have reviewed the triage vital signs and the nursing notes.   HISTORY  Chief Complaint Altered Mental Status    HPI Dana Griffith is a 84 y.o. female w/ a hx of recurrent UTI on chronic macrobid presenting w/ confusion and hypotension.  The patient is accompanied by her daughter and son-in-law, who is a paramedic.  She is severely demented and unable to give any of the history so this is obtained by her family.  Per report, the patient has had a decrease in ability to do ADLs over the last 4 days, and is having restless sleep, especially last night.  Today, she was not acting like herself, and her family took her blood pressure and found her to be 70s over 50s.  Repeat by EMS was 80s over 40s.  The patient has been eating and drinking normally, has not had any fevers, and has no complaints at this time  Past Medical History:  Diagnosis Date  . Atrial fibrillation (HCC)   . Dementia (HCC)   . Diabetes mellitus without complication (HCC)   . Hyperlipemia   . TIA (transient ischemic attack)     Patient Active Problem List   Diagnosis Date Noted  . Altered mental status 06/12/2018    History reviewed. No pertinent surgical history.  Current Outpatient Rx  . Order #: [id number] Class: Historical Med  . Order #: [id number] Class: Historical Med  . Order #: [id number] Class: Print  . Order #: [id number] Class: Historical Med  . Order #: [id number] Class: Historical Med  . Order #: [id number] Class: Historical Med  . Order #: [id number] Class: Historical Med  . Order #: [id number] Class: Historical Med  . Order #: [id number] Class: Historical Med  . Order #: [id number] Class: Print  . Order #: [id number] Class: Historical Med  . Order #: [id number] Class: Historical Med  . Order #: [id number] Class: Historical Med  . Order #: [id number] Class:  Historical Med  . Order #: [id number] Class: Historical Med  . Order #: [id number] Class: Historical Med  . Order #: [id number] Class: Historical Med  . Order #: [id number] Class: Historical Med    Allergies Niaspan [niacin er]  No family history on file.  Social History Social History   Tobacco Use  . Smoking status: Never Smoker  . Smokeless tobacco: Never Used  Substance Use Topics  . Alcohol use: No  . Drug use: Never    Review of Systems Constitutional: No fever/chills.  Lightheadedness or syncope.  Positive altered mental status. ENT: . No congestion or rhinorrhea. Cardiovascular: Denies chest pain. Denies palpitations. Respiratory: Denies shortness of breath.  No cough. Gastrointestinal: No abdominal pain.  No nausea, no vomiting.  No diarrhea.  No constipation.  Eating and drinking normally.  Genitourinary: Negative for dysuria.  No foul-smelling urine.  No urinary frequency. Musculoskeletal: No specific joint pain. Skin: Negative for rash. Neurological: Negative for headaches. No focal numbness, tingling or weakness.     ____________________________________________   PHYSICAL EXAM:  VITAL SIGNS: ED Triage Vitals [06/12/18 1733]  Enc Vitals Group     BP (!) 85/59     Pulse Rate 96     Resp 16     Temp 98.2 F (36.8 C)     Temp Source Oral     SpO2 95 %     Weight 115 lb (52.2 kg)     Height      Head Circumference      Peak Flow  Pain Score      Pain Loc      Pain Edu?      Excl. in GC?     Constitutional: The patient is alert and demented.  Chronically ill-appearing. Eyes: Conjunctivae are normal.  EOMI. No scleral icterus. Head: Atraumatic. Nose: No congestion/rhinnorhea. Mouth/Throat: Mucous membranes are mildly dry.  Neck: No stridor.  Supple.  No JVD.  No meningismus. Cardiovascular: Normal rate, regular rhythm. No murmurs, rubs or gallops.  Respiratory: Normal respiratory effort.  No accessory muscle use or retractions. Lungs CTAB.  No  wheezes, rales or ronchi. Gastrointestinal: Soft, nontender and nondistended.  No guarding or rebound.  No peritoneal signs. Musculoskeletal: No LE edema. No ttp in the calves or palpable cords.  Negative Homan's sign. Neurologic:  A&Ox1.  Speech is clear.  Face and smile are symmetric.  EOMI.  Moves all extremities well. Skin:  Skin is warm, dry and intact. No rash noted. Psychiatric: Mood and affect are normal.  ____________________________________________   LABS (all labs ordered are listed, but only abnormal results are displayed)  Labs Reviewed  COMPREHENSIVE METABOLIC PANEL - Abnormal; Notable for the following components:      Result Value   Glucose, Bld 199 (*)    Creatinine, Ser 1.07 (*)    Alkaline Phosphatase 131 (*)    GFR calc non Af Amer 43 (*)    GFR calc Af Amer 50 (*)    All other components within normal limits  GLUCOSE, CAPILLARY - Abnormal; Notable for the following components:   Glucose-Capillary 187 (*)    All other components within normal limits  URINALYSIS, COMPLETE (UACMP) WITH MICROSCOPIC - Abnormal; Notable for the following components:   Color, Urine AMBER (*)    APPearance CLOUDY (*)    Nitrite POSITIVE (*)    Leukocytes, UA MODERATE (*)    WBC, UA >50 (*)    Bacteria, UA MANY (*)    All other components within normal limits  CBC  CBG MONITORING, ED   ____________________________________________  EKG  ED ECG REPORT I, Anne-Caroline Sharma Covert, the attending physician, personally viewed and interpreted this ECG.   Date: 06/12/2018  EKG Time: 1748  Rate: 90  Rhythm: afib; RBBB  Axis: normal  Intervals:none  ST&T Change: No STEMI  ____________________________________________  RADIOLOGY  No results found.  ____________________________________________   PROCEDURES  Procedure(s) performed: None  Procedures  Critical Care performed: No ____________________________________________   INITIAL IMPRESSION / ASSESSMENT AND PLAN / ED  COURSE  Pertinent labs & imaging results that were available during my care of the patient were reviewed by me and considered in my medical decision making (see chart for details).  84 y.o. with a history of recurrent UTI, A. fib, end-stage dementia, presenting for 4 days of progressively worsening changes in mental status, hypotension today.  Overall, the patient has not been hypotensive here, and is receiving intravenous fluids.  Her laboratory studies are consistent with a urinary tract infection, with a mild elevation in her creatinine.   She has reassuring electrolytes, her white blood cell count is 10.5 and she is not anemic, and her EKG does not show any ischemic changes.    I have had a long discussion with the patient's family, who feels strongly that they would like to give the patient a dose of IV antibiotics with intravenous fluid bolus, and have her discharge home for oral antibiotics.  They have done this in the past and she has successfully improved.  They understand the  risk of hypotension, worsening mental status, worsening infection and even death if they take her home.  They will monitor her closely, and return for any red flag symptoms.  I have reviewed the patient's chart, and her prior urinary cultures have yielded E. coli which were pansensitive.  Rocephin has been ordered here, the patient will be discharged home with Keflex and instructions to continue her Macrobid.  Follow-up instructions as well as return precautions were discussed.  ____________________________________________  FINAL CLINICAL IMPRESSION(S) / ED DIAGNOSES  Final diagnoses:  Hypotension, unspecified hypotension type  Acute cystitis without hematuria  Altered mental status, unspecified altered mental status type         NEW MEDICATIONS STARTED DURING THIS VISIT:  New Prescriptions   CEPHALEXIN (KEFLEX) 500 MG CAPSULE    Take 1 capsule (500 mg total) by mouth 4 (four) times daily for 10 days.       Rockne Menghini, MD 06/12/18 1931

## 2018-06-12 NOTE — ED Notes (Signed)
Rocephin medication d/c by someone. Verbal order by MD Sharma Covert to give medication to patient at this time. Medication started

## 2018-09-02 ENCOUNTER — Encounter: Payer: Self-pay | Admitting: Emergency Medicine

## 2018-09-02 ENCOUNTER — Emergency Department: Payer: Medicare Other

## 2018-09-02 ENCOUNTER — Inpatient Hospital Stay: Payer: Medicare Other

## 2018-09-02 ENCOUNTER — Inpatient Hospital Stay
Admission: EM | Admit: 2018-09-02 | Discharge: 2018-10-09 | DRG: 207 | Disposition: E | Payer: Medicare Other | Attending: Family Medicine | Admitting: Family Medicine

## 2018-09-02 ENCOUNTER — Other Ambulatory Visit: Payer: Self-pay

## 2018-09-02 DIAGNOSIS — Z7982 Long term (current) use of aspirin: Secondary | ICD-10-CM | POA: Diagnosis not present

## 2018-09-02 DIAGNOSIS — J189 Pneumonia, unspecified organism: Secondary | ICD-10-CM | POA: Diagnosis present

## 2018-09-02 DIAGNOSIS — Z6822 Body mass index (BMI) 22.0-22.9, adult: Secondary | ICD-10-CM

## 2018-09-02 DIAGNOSIS — Z66 Do not resuscitate: Secondary | ICD-10-CM | POA: Diagnosis present

## 2018-09-02 DIAGNOSIS — E1165 Type 2 diabetes mellitus with hyperglycemia: Secondary | ICD-10-CM | POA: Diagnosis present

## 2018-09-02 DIAGNOSIS — I080 Rheumatic disorders of both mitral and aortic valves: Secondary | ICD-10-CM | POA: Diagnosis present

## 2018-09-02 DIAGNOSIS — J181 Lobar pneumonia, unspecified organism: Secondary | ICD-10-CM | POA: Diagnosis not present

## 2018-09-02 DIAGNOSIS — E785 Hyperlipidemia, unspecified: Secondary | ICD-10-CM | POA: Diagnosis present

## 2018-09-02 DIAGNOSIS — Z7189 Other specified counseling: Secondary | ICD-10-CM | POA: Diagnosis not present

## 2018-09-02 DIAGNOSIS — J90 Pleural effusion, not elsewhere classified: Secondary | ICD-10-CM | POA: Diagnosis present

## 2018-09-02 DIAGNOSIS — R0602 Shortness of breath: Secondary | ICD-10-CM

## 2018-09-02 DIAGNOSIS — I5021 Acute systolic (congestive) heart failure: Secondary | ICD-10-CM | POA: Diagnosis present

## 2018-09-02 DIAGNOSIS — I5043 Acute on chronic combined systolic (congestive) and diastolic (congestive) heart failure: Secondary | ICD-10-CM | POA: Diagnosis not present

## 2018-09-02 DIAGNOSIS — I493 Ventricular premature depolarization: Secondary | ICD-10-CM | POA: Diagnosis not present

## 2018-09-02 DIAGNOSIS — Z8673 Personal history of transient ischemic attack (TIA), and cerebral infarction without residual deficits: Secondary | ICD-10-CM | POA: Diagnosis not present

## 2018-09-02 DIAGNOSIS — I110 Hypertensive heart disease with heart failure: Secondary | ICD-10-CM | POA: Diagnosis present

## 2018-09-02 DIAGNOSIS — Z9911 Dependence on respirator [ventilator] status: Secondary | ICD-10-CM

## 2018-09-02 DIAGNOSIS — I255 Ischemic cardiomyopathy: Secondary | ICD-10-CM | POA: Diagnosis present

## 2018-09-02 DIAGNOSIS — Z9841 Cataract extraction status, right eye: Secondary | ICD-10-CM

## 2018-09-02 DIAGNOSIS — J9601 Acute respiratory failure with hypoxia: Principal | ICD-10-CM | POA: Diagnosis present

## 2018-09-02 DIAGNOSIS — Z888 Allergy status to other drugs, medicaments and biological substances status: Secondary | ICD-10-CM | POA: Diagnosis not present

## 2018-09-02 DIAGNOSIS — R570 Cardiogenic shock: Secondary | ICD-10-CM | POA: Diagnosis present

## 2018-09-02 DIAGNOSIS — E440 Moderate protein-calorie malnutrition: Secondary | ICD-10-CM | POA: Diagnosis present

## 2018-09-02 DIAGNOSIS — I272 Pulmonary hypertension, unspecified: Secondary | ICD-10-CM | POA: Diagnosis present

## 2018-09-02 DIAGNOSIS — J9600 Acute respiratory failure, unspecified whether with hypoxia or hypercapnia: Secondary | ICD-10-CM

## 2018-09-02 DIAGNOSIS — Z9049 Acquired absence of other specified parts of digestive tract: Secondary | ICD-10-CM

## 2018-09-02 DIAGNOSIS — R296 Repeated falls: Secondary | ICD-10-CM | POA: Diagnosis present

## 2018-09-02 DIAGNOSIS — Z515 Encounter for palliative care: Secondary | ICD-10-CM | POA: Diagnosis not present

## 2018-09-02 DIAGNOSIS — I4891 Unspecified atrial fibrillation: Secondary | ICD-10-CM | POA: Diagnosis present

## 2018-09-02 DIAGNOSIS — Z9842 Cataract extraction status, left eye: Secondary | ICD-10-CM

## 2018-09-02 DIAGNOSIS — Z7951 Long term (current) use of inhaled steroids: Secondary | ICD-10-CM

## 2018-09-02 DIAGNOSIS — K567 Ileus, unspecified: Secondary | ICD-10-CM

## 2018-09-02 DIAGNOSIS — Z794 Long term (current) use of insulin: Secondary | ICD-10-CM | POA: Diagnosis not present

## 2018-09-02 DIAGNOSIS — I340 Nonrheumatic mitral (valve) insufficiency: Secondary | ICD-10-CM | POA: Diagnosis not present

## 2018-09-02 DIAGNOSIS — E119 Type 2 diabetes mellitus without complications: Secondary | ICD-10-CM

## 2018-09-02 DIAGNOSIS — F0390 Unspecified dementia without behavioral disturbance: Secondary | ICD-10-CM | POA: Diagnosis present

## 2018-09-02 HISTORY — DX: Syndrome of inappropriate secretion of antidiuretic hormone: E22.2

## 2018-09-02 LAB — CBC WITH DIFFERENTIAL/PLATELET
Abs Immature Granulocytes: 0.04 10*3/uL (ref 0.00–0.07)
Basophils Absolute: 0 10*3/uL (ref 0.0–0.1)
Basophils Relative: 0 %
Eosinophils Absolute: 0.1 10*3/uL (ref 0.0–0.5)
Eosinophils Relative: 1 %
HCT: 37.1 % (ref 36.0–46.0)
Hemoglobin: 11.5 g/dL — ABNORMAL LOW (ref 12.0–15.0)
Immature Granulocytes: 0 %
Lymphocytes Relative: 9 %
Lymphs Abs: 0.9 10*3/uL (ref 0.7–4.0)
MCH: 29.1 pg (ref 26.0–34.0)
MCHC: 31 g/dL (ref 30.0–36.0)
MCV: 93.9 fL (ref 80.0–100.0)
Monocytes Absolute: 0.6 10*3/uL (ref 0.1–1.0)
Monocytes Relative: 6 %
Neutro Abs: 8.3 10*3/uL — ABNORMAL HIGH (ref 1.7–7.7)
Neutrophils Relative %: 84 %
Platelets: 216 10*3/uL (ref 150–400)
RBC: 3.95 MIL/uL (ref 3.87–5.11)
RDW: 13.7 % (ref 11.5–15.5)
WBC: 9.9 10*3/uL (ref 4.0–10.5)
nRBC: 0 % (ref 0.0–0.2)

## 2018-09-02 LAB — COMPREHENSIVE METABOLIC PANEL
ALT: 12 U/L (ref 0–44)
AST: 21 U/L (ref 15–41)
Albumin: 3.6 g/dL (ref 3.5–5.0)
Alkaline Phosphatase: 116 U/L (ref 38–126)
Anion gap: 5 (ref 5–15)
BUN: 18 mg/dL (ref 8–23)
CO2: 29 mmol/L (ref 22–32)
Calcium: 9 mg/dL (ref 8.9–10.3)
Chloride: 103 mmol/L (ref 98–111)
Creatinine, Ser: 0.94 mg/dL (ref 0.44–1.00)
GFR calc Af Amer: >60 mL/min (ref >60)
GFR, EST NON AFRICAN AMERICAN: 52 mL/min — AB (ref >60)
Glucose, Bld: 235 mg/dL — ABNORMAL HIGH (ref 70–99)
POTASSIUM: 4.3 mmol/L (ref 3.5–5.1)
Sodium: 137 mmol/L (ref 135–145)
Total Bilirubin: 0.5 mg/dL (ref 0.3–1.2)
Total Protein: 7.1 g/dL (ref 6.5–8.1)

## 2018-09-02 LAB — BLOOD GAS, VENOUS
PH VEN: 7.32 (ref 7.250–7.430)
Patient temperature: 37
pCO2, Ven: 59 mmHg (ref 44.0–60.0)
pO2, Ven: 34 mmHg (ref 32.0–45.0)

## 2018-09-02 LAB — INFLUENZA PANEL BY PCR (TYPE A & B)
Influenza A By PCR: NEGATIVE
Influenza B By PCR: NEGATIVE

## 2018-09-02 MED ORDER — ENOXAPARIN SODIUM 40 MG/0.4ML ~~LOC~~ SOLN: 40.0000 mg | SUBCUTANEOUS | Status: DC

## 2018-09-02 MED ORDER — ROCURONIUM BROMIDE 50 MG/5ML IV SOLN
1.0000 mg/kg | Freq: Once | INTRAVENOUS | Status: AC
  Administered 2018-09-02: 52.2 mg via INTRAVENOUS

## 2018-09-02 MED ORDER — FENTANYL 2500MCG IN NS 250ML (10MCG/ML) PREMIX INFUSION
0.0000 ug/h | INTRAVENOUS | Status: DC
  Administered 2018-09-02: 25 ug/h via INTRAVENOUS
  Administered 2018-09-04 – 2018-09-05 (×3): 125 ug/h via INTRAVENOUS
  Filled 2018-09-02 (×4): qty 250

## 2018-09-02 MED ORDER — KETAMINE HCL 10 MG/ML IJ SOLN
2.0000 mg/kg | INTRAMUSCULAR | Status: AC
  Administered 2018-09-02: 104 mg via INTRAVENOUS

## 2018-09-02 MED ORDER — ALBUTEROL SULFATE (2.5 MG/3ML) 0.083% IN NEBU
5.0000 mg | INHALATION_SOLUTION | Freq: Once | RESPIRATORY_TRACT | Status: AC
  Administered 2018-09-02: 5 mg via RESPIRATORY_TRACT
  Filled 2018-09-02: qty 6

## 2018-09-02 MED ORDER — ENOXAPARIN SODIUM 40 MG/0.4ML ~~LOC~~ SOLN
30.0000 mg | SUBCUTANEOUS | Status: DC
  Administered 2018-09-03 – 2018-09-05 (×3): 30 mg via SUBCUTANEOUS
  Filled 2018-09-02 (×3): qty 0.4

## 2018-09-02 MED ORDER — SODIUM CHLORIDE 0.9 % IV SOLN
500.0000 mg | Freq: Once | INTRAVENOUS | Status: AC
  Administered 2018-09-02: 500 mg via INTRAVENOUS
  Filled 2018-09-02: qty 500

## 2018-09-02 MED ORDER — FAMOTIDINE IN NACL 20-0.9 MG/50ML-% IV SOLN
20.0000 mg | Freq: Two times a day (BID) | INTRAVENOUS | Status: DC
  Filled 2018-09-02: qty 50

## 2018-09-02 MED ORDER — PROPOFOL 1000 MG/100ML IV EMUL
5.0000 ug/kg/min | INTRAVENOUS | Status: DC
  Administered 2018-09-02: 20 ug/kg/min via INTRAVENOUS
  Administered 2018-09-03: 2 ug/kg/min via INTRAVENOUS
  Administered 2018-09-04 – 2018-09-06 (×3): 20 ug/kg/min via INTRAVENOUS
  Filled 2018-09-02 (×5): qty 100

## 2018-09-02 MED ORDER — FENTANYL CITRATE (PF) 100 MCG/2ML IJ SOLN
50.0000 ug | Freq: Once | INTRAMUSCULAR | Status: AC
  Administered 2018-09-02: 50 ug via INTRAVENOUS

## 2018-09-02 MED ORDER — SODIUM CHLORIDE 0.9 % IV BOLUS
1000.0000 mL | Freq: Once | INTRAVENOUS | Status: AC
  Administered 2018-09-02: 1000 mL via INTRAVENOUS

## 2018-09-02 MED ORDER — FENTANYL CITRATE (PF) 100 MCG/2ML IJ SOLN: 50.0000 ug | Freq: Once | INTRAMUSCULAR | Status: DC

## 2018-09-02 MED ORDER — ONDANSETRON HCL 4 MG/2ML IJ SOLN: 4.0000 mg | Freq: Four times a day (QID) | INTRAMUSCULAR | Status: DC | PRN

## 2018-09-02 MED ORDER — SODIUM CHLORIDE 0.9 % IV SOLN
1.0000 g | Freq: Once | INTRAVENOUS | Status: AC
  Administered 2018-09-02: 1 g via INTRAVENOUS
  Filled 2018-09-02: qty 10

## 2018-09-02 NOTE — ED Notes (Signed)
Patient transported to CT 

## 2018-09-02 NOTE — ED Provider Notes (Addendum)
[facility] Emergency Department Provider Note  ____________________________________________  Time seen: Approximately 7:24 PM  I have reviewed the triage vital signs and the nursing notes.   HISTORY  Chief Complaint Shortness of Breath  Level 5 Caveat: Portions of the History and Physical including HPI and review of systems are unable to be completely obtained due to patient being a poor historian    HPI Dana Griffith is a 84 y.o. female with a history of atrial fibrillation, severe dementia, diabetes, hyperlipidemia who has been having gradual onset shortness of breath and cough for the past month, worsening.  Cough is nonproductive.  Patient was wheezing and having difficulty breathing today even worse, so EMS were called, found the patient to have a room air oxygen saturation of 78% with dusky skin color.   Patient was given 2 duo nebs and 125 of Solu-Medrol on route.     Past Medical History:  Diagnosis Date  . Atrial fibrillation (HCC)   . Dementia (HCC)   . Diabetes mellitus without complication (HCC)   . Hyperlipemia   . TIA (transient ischemic attack)      Patient Active Problem List   Diagnosis Date Noted  . Altered mental status 06/12/2018     Past Surgical History:  Procedure Laterality Date  . CATARACT EXTRACTION Bilateral   . CHOLECYSTECTOMY       Prior to Admission medications   Medication Sig Start Date End Date Taking? Authorizing Provider  aspirin EC 81 MG tablet Take 81 mg 2 (two) times daily by mouth.    [provider]  atorvastatin (LIPITOR) 20 MG tablet Take 20 mg by mouth daily. 09/24/16   [provider]  Cholecalciferol (VITAMIN D3) 2000 units TABS Take 1 tablet daily by mouth.    [provider]  digoxin (LANOXIN) 0.125 MG tablet Take 125 mcg by mouth daily. 09/23/16   [provider]  diltiazem (CARDIZEM SR) 60 MG 12 hr capsule Take 60 mg by mouth 2 (two) times daily. 09/23/16    [provider]  escitalopram (LEXAPRO) 10 MG tablet Take 10 mg daily by mouth.    [provider]  fluticasone (FLONASE) 50 MCG/ACT nasal spray Place 1 spray 2 (two) times daily into both nostrils.    [provider]  HUMULIN R 100 UNIT/ML injection Inject 8 Units 4 (four) times daily as needed into the skin.  09/28/16   [provider]  HYDROcodone-acetaminophen (NORCO/VICODIN) 5-325 MG tablet Take 1 tablet by mouth every 6 (six) hours as needed for severe pain. 06/01/17   Cuthriell, Delorise Royals, PA-C  isosorbide mononitrate (IMDUR) 30 MG 24 hr tablet Take 30 mg daily by mouth.    [provider]  lisinopril (PRINIVIL,ZESTRIL) 10 MG tablet Take 10 mg by mouth daily. 09/23/16   [provider]  metoCLOPramide (REGLAN) 5 MG tablet Take 1 tablet 2 (two) times daily by mouth.  09/20/16   [provider]  Multiple Vitamin (MULTIVITAMIN) tablet Take 1 tablet daily by mouth.    [provider]  nitroGLYCERIN (NITROSTAT) 0.4 MG SL tablet Place 1 tablet under the tongue every 5 (five) minutes x 3 doses as needed. 09/24/16   [provider]  omeprazole (PRILOSEC) 40 MG capsule Take 40 mg daily by mouth.    [provider]  ondansetron (ZOFRAN-ODT) 4 MG disintegrating tablet Take 4 mg by mouth every 6 (six) hours. 09/27/16   [provider]  sodium chloride 1 g tablet Take  1 g 2 (two) times daily with a meal by mouth.    [provider]     Allergies Niaspan [niacin er]   History reviewed. No pertinent family history.  Social History Social History   Tobacco Use  . Smoking status: Never Smoker  . Smokeless tobacco: Never Used  Substance Use Topics  . Alcohol use: No  . Drug use: Never    Review of Systems  Constitutional:   No fever.   Cardiovascular:   No chest pain or syncope. Respiratory: Positive shortness of breath and cough. Gastrointestinal:   Negative for abdominal pain, vomiting  and diarrhea.  Musculoskeletal:   Negative for focal pain or swelling All other systems reviewed and are negative except as documented above in ROS and HPI.  ____________________________________________   PHYSICAL EXAM:  VITAL SIGNS: ED Triage Vitals [08/19/2018 1753]  Enc Vitals Group     BP 126/75     Pulse Rate 86     Resp 19     Temp 97.7 F (36.5 C)     Temp Source Oral     SpO2 92 %     Weight 115 lb (52.2 kg)     Height 5\' 2"  (1.575 m)     Head Circumference      Peak Flow      Pain Score      Pain Loc      Pain Edu?      Excl. in GC?     Vital signs reviewed, nursing assessments reviewed.   Constitutional:   Alert and oriented to self.  Ill-appearing. Eyes:   Conjunctivae are normal. EOMI. PERRL. ENT      Head:   Normocephalic and atraumatic.      Nose:   No congestion/rhinnorhea.       Mouth/Throat:   Dry mucous membranes, no pharyngeal erythema. No peritonsillar mass.       Neck:   No meningismus. Full ROM. Hematological/Lymphatic/Immunilogical:   No cervical lymphadenopathy. Cardiovascular:   Irregularly irregular rhythm, rate controlled. Symmetric bilateral radial and DP pulses.  No murmurs. Cap refill less than 2 seconds. Respiratory:   Normal respiratory effort without tachypnea/retractions.  Breath sounds are symmetric, good air entry in all lung fields, bibasilar crackles. Gastrointestinal:   Soft and nontender. Non distended. There is no CVA tenderness.  No rebound, rigidity, or guarding.  Musculoskeletal:   Normal range of motion in all extremities. No joint effusions.  No lower extremity tenderness.  No edema. Neurologic:   Normal speech and language.  Motor grossly intact. No acute focal neurologic deficits are appreciated.  Skin:    Skin is warm, dry and intact. No rash noted.  No petechiae, purpura, or bullae.  ____________________________________________    LABS (pertinent positives/negatives) (all labs ordered are listed, but only abnormal  results are displayed) Labs Reviewed  COMPREHENSIVE METABOLIC PANEL - Abnormal; Notable for the following components:      Result Value   Glucose, Bld 235 (*)    GFR calc non Af Amer 52 (*)    All other components within normal limits  CBC WITH DIFFERENTIAL/PLATELET - Abnormal; Notable for the following components:   Hemoglobin 11.5 (*)    Neutro Abs 8.3 (*)    All other components within normal limits  BLOOD GAS, VENOUS  INFLUENZA PANEL BY PCR (TYPE A & B)   ____________________________________________   EKG Interpreted by me Atrial fibrillation rate 104, normal axis and intervals.  Right bundle branch block.  T  wave inversion in V2 and V3 which appears old compared to previous EKGs.   ____________________________________________    RADIOLOGY  Dg Chest Portable 1 View  Result Date: 08/11/2018 CLINICAL DATA:  Hypoxia, shortness of Breath EXAM: PORTABLE CHEST 1 VIEW COMPARISON:  07/17/2017 FINDINGS: Cardiomegaly with vascular congestion. Moderate bilateral pleural effusions with bibasilar atelectasis or infiltrates. No acute bony abnormality. IMPRESSION: Cardiomegaly with vascular congestion. Moderate effusions with bibasilar atelectasis or pneumonia. Electronically Signed   By: Charlett NoseKevin  Dover M.D.   On: 08/23/2018 18:14    ____________________________________________   PROCEDURES .Critical Care Performed by: Sharman CheekStafford, Cyprian Gongaware, MD Authorized by: Sharman CheekStafford, Zakariya Knickerbocker, MD   Critical care provider statement:    Critical care time (minutes):  30   Critical care time was exclusive of:  Separately billable procedures and treating other patients   Critical care was necessary to treat or prevent imminent or life-threatening deterioration of the following conditions:  Respiratory failure   Critical care was time spent personally by me on the following activities:  Development of treatment plan with patient or surrogate, discussions with consultants, evaluation of patient's response to  treatment, examination of patient, obtaining history from patient or surrogate, ordering and performing treatments and interventions, ordering and review of laboratory studies, ordering and review of radiographic studies, pulse oximetry, re-evaluation of patient's condition and review of old charts  Procedure Name: Intubation Date/Time: 08/30/2018 9:14 PM Performed by: Sharman CheekStafford, Donn Zanetti, MD Pre-anesthesia Checklist: Patient identified, Patient being monitored, Emergency Drugs available, Timeout performed and Suction available Oxygen Delivery Method: Non-rebreather mask Preoxygenation: Pre-oxygenation with 100% oxygen Induction Type: Rapid sequence Ventilation: Mask ventilation without difficulty Laryngoscope Size: Glidescope and 3 Grade View: Grade I Tube type: Subglottic suction tube Tube size: 7.5 mm Number of attempts: 1 Placement Confirmation: ETT inserted through vocal cords under direct vision,  CO2 detector and Breath sounds checked- equal and bilateral Secured at: 22 cm Tube secured with: ETT holder Dental Injury: Teeth and Oropharynx as per pre-operative assessment  Comments: Patient intubated and a semirecumbent position with high flow nasal cannula throughout intubation procedure due to anticipated risk of rapid desaturation with her pleural effusions and pneumonia.  Patient was able to be intubated without difficulty with glide scope, no desaturation, lowest oxygen saturation throughout procedure was 98%.       ____________________________________________  DIFFERENTIAL DIAGNOSIS   Pulmonary edema, influenza, bacterial pneumonia, pleural effusion  CLINICAL IMPRESSION / ASSESSMENT AND PLAN / ED COURSE  Pertinent labs & imaging results that were available during my care of the patient were reviewed by me and considered in my medical decision making (see chart for details).      Clinical Course as of Sep 02 2113  Thu Sep 02, 2018  1844 Patient presents with severe  hypoxia, room air sat of 78%.  Improved with nasal cannula oxygen.  Not in respiratory distress.  Chest x-ray shows diffuse infiltrate which I suspect to be due to influenza.  Afebrile, otherwise normal vital signs.  We will follow-up labs prior to initiating antimicrobial therapy.   [PS]  1854 White blood cell count is normal but he is neutrophil dominant.  I will give a dose of ceftriaxone and azithromycin while waiting for the influenza panel.  NEUT#(!): 8.3 [PS]  1923 Flu negative. Will continue tx for cap.  Influenza panel by PCR (type A & B) [PS]  1931 Will give additional albuterol.  With some diagnostic uncertainty of pleural effusion/edema versus pneumonia given the isolated hypoxia without fever or significantly elevated white blood  cell count, will obtain a noncontrast CT scan of the chest to further evaluate.   [PS]  2040 CT chest concerning for right upper lobe pneumonia as well as bilateral pleural effusions, left greater than right.  No significant pulmonary edema.  Does not appear that diuretics would be beneficial.   [PS]  2056 Clinically deteriorating.  Fast shallow breathing, now afib / RVR. Confirmed full code with family member at bedside. Emergency situation, will intubate.   [PS]    Clinical Course User Index [PS] Sharman Cheek, MD     ----------------------------------------- 9:14 PM on 15-Sep-2018 -----------------------------------------  Intubated without complication.  We will maintain her on fentanyl and propofol drip.  ____________________________________________   FINAL CLINICAL IMPRESSION(S) / ED DIAGNOSES    Final diagnoses:  SOB (shortness of breath)  Community acquired pneumonia, unspecified laterality  Acute respiratory failure with hypoxia [facility])     ED Discharge Orders    None      Portions of this note were generated with dragon dictation software. Dictation errors may occur despite best attempts at proofreading.   Sharman Cheek, MD September 15, 2018 Serena Croissant    Sharman Cheek, MD 09-15-2018 Daphane Shepherd    Sharman Cheek, MD 2018-09-15 2116    Sharman Cheek, MD 09/30/18 [phone]

## 2018-09-02 NOTE — ED Notes (Signed)
Admitting MD at bedside.

## 2018-09-02 NOTE — ED Notes (Addendum)
Pt with shallow breathing and tachypnea, afib 130 HR, patient arousable put lethargic.  MD aware, spoke with patient family in room about code status, family denies legal guardian or HPOA on patient.  Decision by MD and family at this time to intubate patient.

## 2018-09-02 NOTE — H&P (Signed)
[facility] - [place] at [facility]   PATIENT NAME: Dana Griffith    MR#:  [id number]  DATE OF BIRTH:  Nov 29, 1921  DATE OF ADMISSION:  2017/10/07  PRIMARY CARE PHYSICIAN: Patient, No Pcp Per   REQUESTING/REFERRING PHYSICIAN: Scotty CourtStafford, MD  CHIEF COMPLAINT:   Chief Complaint  Patient presents with  . Shortness of Breath    HISTORY OF PRESENT ILLNESS:  Dana Griffith  is a 84 y.o. female who presents with chief complaint as above.  Patient presents to the ED after about a month of progressive respiratory symptoms.  She is currently intubated and cannot provide information to her HPI, history is given by family member at bedside.  Family member states that she developed a cough around Thanksgiving time when she was visiting relatives in [place].  She came back and had an evaluation performed by her primary care physician, which did not reveal anything significant.  Since that time she has had a persistent cough which is been getting slowly worse.  Over the last couple of weeks she began having increased shortness of breath, which got significantly worse over the last several days.  She came to the ED for evaluation tonight and was found to have pneumonia as well as pleural effusions.  Initially her oxygen level stabilized with supplemental oxygen via nasal cannula, however then she converted to A. fib RVR and her breathing got acutely worse, requiring intubation.  Hospitalist were called for admission  PAST MEDICAL HISTORY:   Past Medical History:  Diagnosis Date  . Atrial fibrillation (HCC)   . Dementia (HCC)   . Diabetes mellitus without complication (HCC)   . Hyperlipemia   . TIA (transient ischemic attack)      PAST SURGICAL HISTORY:   Past Surgical History:  Procedure Laterality Date  . CATARACT EXTRACTION Bilateral   . CHOLECYSTECTOMY       SOCIAL HISTORY:   Social History   Tobacco Use  . Smoking status: Never Smoker  . Smokeless  tobacco: Never Used  Substance Use Topics  . Alcohol use: No     FAMILY HISTORY:  Patient unable to relate family history due to critical condition   DRUG ALLERGIES:   Allergies  Allergen Reactions  . Niaspan [Niacin Er] Other (See Comments)    unknown    MEDICATIONS AT HOME:   Prior to Admission medications   Medication Sig Start Date End Date Taking? Authorizing Provider  aspirin EC 81 MG tablet Take 81 mg 2 (two) times daily by mouth.    [provider]  atorvastatin (LIPITOR) 20 MG tablet Take 20 mg by mouth daily. 09/24/16   [provider]  Cholecalciferol (VITAMIN D3) 2000 units TABS Take 1 tablet daily by mouth.    [provider]  digoxin (LANOXIN) 0.125 MG tablet Take 125 mcg by mouth daily. 09/23/16   [provider]  diltiazem (CARDIZEM SR) 60 MG 12 hr capsule Take 60 mg by mouth 2 (two) times daily. 09/23/16   [provider]  escitalopram (LEXAPRO) 10 MG tablet Take 10 mg daily by mouth.    [provider]  fluticasone (FLONASE) 50 MCG/ACT nasal spray Place 1 spray 2 (two) times daily into both nostrils.    [provider]  HUMULIN R 100 UNIT/ML injection Inject 8 Units 4 (four) times daily as needed into the skin.  09/28/16   [provider]  HYDROcodone-acetaminophen (NORCO/VICODIN) 5-325 MG tablet Take 1 tablet by mouth every 6 (six) hours  as needed for severe pain. 06/01/17   Cuthriell, Delorise Royals, PA-C  isosorbide mononitrate (IMDUR) 30 MG 24 hr tablet Take 30 mg daily by mouth.    [provider]  lisinopril (PRINIVIL,ZESTRIL) 10 MG tablet Take 10 mg by mouth daily. 09/23/16   [provider]  metoCLOPramide (REGLAN) 5 MG tablet Take 1 tablet 2 (two) times daily by mouth.  09/20/16   [provider]  Multiple Vitamin (MULTIVITAMIN) tablet Take 1 tablet daily by mouth.    [provider]  nitroGLYCERIN (NITROSTAT) 0.4 MG SL tablet Place 1 tablet under the tongue  every 5 (five) minutes x 3 doses as needed. 09/24/16   [provider]  omeprazole (PRILOSEC) 40 MG capsule Take 40 mg daily by mouth.    [provider]  ondansetron (ZOFRAN-ODT) 4 MG disintegrating tablet Take 4 mg by mouth every 6 (six) hours. 09/27/16   [provider]  sodium chloride 1 g tablet Take 1 g 2 (two) times daily with a meal by mouth.    [provider]    REVIEW OF SYSTEMS:  Review of Systems  Unable to perform ROS: Critical illness     VITAL SIGNS:   Vitals:   08/31/2018 2000 08/13/2018 2030 08/23/2018 2059 08/19/2018 2105  BP: (!) 141/83 (!) 133/91    Pulse: (!) 117 (!) 113 (!) 142 (!) 118  Resp: (!) 26 (!) 34 (!) 24 (!) 34  Temp:      TempSrc:      SpO2: 90% 92% 92% 99%  Weight:      Height:       Wt Readings from Last 3 Encounters:  08/17/2018 52.2 kg  07/17/17 47.6 kg  06/01/17 47.6 kg    PHYSICAL EXAMINATION:  Physical Exam  Vitals reviewed. Constitutional: She is oriented to person, place, and time. She appears well-developed and well-nourished. No distress.  HENT:  Head: Normocephalic and atraumatic.  Mouth/Throat: Oropharynx is clear and moist.  Eyes: Pupils are equal, round, and reactive to light. Conjunctivae and EOM are normal. No scleral icterus.  Neck: Normal range of motion. Neck supple. No JVD present. No thyromegaly present.  Cardiovascular: Intact distal pulses. Exam reveals no gallop and no friction rub.  No murmur heard. Tachycardic, irregular rhythm  Respiratory: She is in respiratory distress (Intubated). She has wheezes. She has rales.  GI: Soft. Bowel sounds are normal. She exhibits no distension. There is no abdominal tenderness.  Musculoskeletal: Normal range of motion.        General: No edema.     Comments: No arthritis, no gout  Lymphadenopathy:    She has no cervical adenopathy.  Neurological: She is alert and oriented to person, place, and time. No cranial nerve deficit.  No dysarthria, no  aphasia  Skin: Skin is warm and dry. No rash noted. No erythema.  Psychiatric: She has a normal mood and affect. Her behavior is normal. Judgment and thought content normal.    LABORATORY PANEL:   CBC Recent Labs  Lab 09/06/2018 1826  WBC 9.9  HGB 11.5*  HCT 37.1  PLT 216   ------------------------------------------------------------------------------------------------------------------  Chemistries  Recent Labs  Lab 08/23/2018 1826  NA 137  K 4.3  CL 103  CO2 29  GLUCOSE 235*  BUN 18  CREATININE 0.94  CALCIUM 9.0  AST 21  ALT 12  ALKPHOS 116  BILITOT 0.5   ------------------------------------------------------------------------------------------------------------------  Cardiac Enzymes No results for input(s): TROPONINI in the last 168 hours. ------------------------------------------------------------------------------------------------------------------  RADIOLOGY:  Ct Chest Wo Contrast  Result Date: 06-29-18 CLINICAL DATA:  Dyspnea cough since Thanksgiving. EXAM: CT CHEST WITHOUT CONTRAST TECHNIQUE: Multidetector CT imaging of the chest was performed following the standard protocol without IV contrast. COMPARISON:  CXR 06-29-18 and 09/15/2010 FINDINGS: Cardiovascular: Cardiomegaly with aortic atherosclerosis is noted with left main and three-vessel coronary arteriosclerosis. No pericardial effusion or thickening. The thoracic aorta is nonaneurysmal. Unenhanced pulmonary vasculature is unremarkable. Mediastinum/Nodes: No thyromegaly or mass. Atherosclerosis of the great vessels. Small subcentimeter short axis mediastinal nodes are noted. Assessment for hilar adenopathy is limited due to lack of IV contrast. Small anterior chest wall hypodense subcutaneous nodule likely representing small sebaceous cyst is noted measuring up to 2.2 cm. Lungs/Pleura: Left greater than right moderate pleural effusions with adjacent compressive atelectasis. Respiratory motion artifacts  limit assessment. Subtle nodular density in the left upper lobe measuring 4 mm, series 3/45 is identified. Posterior segment right upper lobe pulmonary consolidation is visualized, series 3/59 with air bronchograms. Atelectasis or pneumonia might account for this appearance. No pneumothorax is seen. Upper Abdomen: No acute abnormality. Musculoskeletal: Thoracic spondylosis withage-indeterminate likely chronic mild inferior endplate compression of T8 and superior endplate of T10. IMPRESSION: 1. Moderate left greater than right pleural effusions with adjacent compressive atelectasis. 2. Posterior segment right upper lobe pulmonary consolidation with air bronchograms may represent atelectasis or pneumonia. 3. 4 mm nodular density in the left upper lobe. No follow-up needed if patient is low-risk. Non-contrast chest CT can be considered in 12 months if patient is high-risk. This recommendation follows the consensus statement: Guidelines for Management of Incidental Pulmonary Nodules Detected on CT Images: From the Fleischner Society 2017; Radiology 2017; 284:228-243. 4. Thoracic spondylosis. Mild inferior endplate compression of T8 and superior endplate compression T10 likely remote. No retropulsion. Aortic Atherosclerosis (ICD10-I70.0). Electronically Signed   By: Tollie Ethavid  Kwon M.D.   On: 06-29-18 20:09   Dg Chest Portable 1 View  Result Date: 06-29-18 CLINICAL DATA:  Respiratory failure. Status post intubation. EXAM: PORTABLE CHEST 1 VIEW COMPARISON:  Same day chest CT and CXR FINDINGS: New endotracheal tube tip is identified approximately 3.2 cm above the carina in satisfactory position. Right greater than left pleural effusions with atelectasis and interstitial edema is noted. Aortic atherosclerosis is identified. No acute osseous appearing abnormality. IMPRESSION: 1. Satisfactory endotracheal tube position. 2. Stable cardiomegaly and aortic atherosclerosis. 3. Right greater than left pleural effusions with  atelectasis and interstitial edema. Electronically Signed   By: Tollie Ethavid  Kwon M.D.   On: 06-29-18 21:58   Dg Chest Portable 1 View  Result Date: 06-29-18 CLINICAL DATA:  Hypoxia, shortness of Breath EXAM: PORTABLE CHEST 1 VIEW COMPARISON:  07/17/2017 FINDINGS: Cardiomegaly with vascular congestion. Moderate bilateral pleural effusions with bibasilar atelectasis or infiltrates. No acute bony abnormality. IMPRESSION: Cardiomegaly with vascular congestion. Moderate effusions with bibasilar atelectasis or pneumonia. Electronically Signed   By: Charlett NoseKevin  Dover M.D.   On: 06-29-18 18:14    EKG:   Orders placed or performed during the hospital encounter of 2018/08/06  . ED EKG  . ED EKG  . EKG 12-Lead  . EKG 12-Lead    IMPRESSION AND PLAN:  Principal Problem:   Acute respiratory failure with hypoxia (HCC) -due to pneumonia, pleural effusions, and subsequently A. fib with RVR.  Patient was initially somewhat stable with supplemental oxygen via nasal cannula, but when she converted into A. fib with RVR she likely had no reserve and had significant respiratory decompensation requiring intubation.  Will admit to ICU, and maintain  intubation with mechanical ventilation for now Active Problems:   CAP (community acquired pneumonia) -IV antibiotics in place, supportive treatment with intubation as above, as well as duo nebs and other as needed treatments   Pleural effusion -patient does not have a history of heart failure, effusions are potentially related to acute heart failure in the setting of her acute illness, or potentially due to her pneumonia.  Treatment as above, monitor for improvement could use diuretics if needed once her other problems have started to resolve   Atrial fibrillation with RVR (HCC) -patient's heart rate initially rose to the 140s, she was given some IV Cardizem.  Her heart rate improved, dropping to the 110s.  However, after she was intubated her blood pressure dropped with propofol  dosing, prohibiting any further nodal blocking agent.  Further treatment can be given once her blood pressure improves   HLD (hyperlipidemia) -Home dose antilipid   Diabetes (HCC) -sliding scale insulin coverage   Dementia (HCC) -not on medications for this  Chart review performed and case discussed with ED provider. Labs, imaging and/or ECG reviewed by provider and discussed with patient/family. Management plans discussed with the patient and/or family.  DVT PROPHYLAXIS: SubQ lovenox   GI PROPHYLAXIS:  H2 blocker  ADMISSION STATUS: Inpatient     CODE STATUS: Full Code Status History    Date Active Date Inactive Code Status Order ID Comments User Context   06/12/2018 1907 06/12/2018 1907 Full Code [id number]  Ihor Austin, MD ED    Advance Directive Documentation     Most Recent Value  Type of Advance Directive  Healthcare Power of Attorney  Pre-existing out of facility DNR order (yellow form or pink MOST form)  -  "MOST" Form in Place?  -      TOTAL CRITICAL CARE TIME TAKING CARE OF THIS PATIENT: 50 minutes.   Oleg Oleson FIELDING 08/21/2018, 10:33 PM  [facility]  Office  [phone]  CC: Primary care physician; Patient, No Pcp Per  Note:  This document was prepared using Dragon voice recognition software and may include unintentional dictation errors.

## 2018-09-02 NOTE — ED Notes (Signed)
ED Provider at bedside. 

## 2018-09-02 NOTE — ED Notes (Signed)
Pt becoming agitated while being moved for xray.  VORB for bolus of fentanyl 50mcg given by Dr. Anne HahnWillis and administered by this RN.

## 2018-09-02 NOTE — ED Triage Notes (Signed)
Pt to ED via EMS from home c/o SOB and cough for since Thanksgiving and getting worse, congested cough but unproductive.  Pt found to be 78% RA by EMS on scene rhonchi and wheezing throughout and lethargic, states gray in color.  Given 2 duoneb treatments, 125mg  solumedrol en route, CBG 226.  Hx of afib, dementia, and DBM.

## 2018-09-02 NOTE — Progress Notes (Signed)
Lovenox changed to 30 mg daily for BMI <40 and CrCl <30 

## 2018-09-03 ENCOUNTER — Inpatient Hospital Stay: Payer: Medicare Other

## 2018-09-03 ENCOUNTER — Other Ambulatory Visit: Payer: Self-pay

## 2018-09-03 DIAGNOSIS — I4891 Unspecified atrial fibrillation: Secondary | ICD-10-CM

## 2018-09-03 DIAGNOSIS — J90 Pleural effusion, not elsewhere classified: Secondary | ICD-10-CM

## 2018-09-03 DIAGNOSIS — J181 Lobar pneumonia, unspecified organism: Secondary | ICD-10-CM

## 2018-09-03 DIAGNOSIS — J9601 Acute respiratory failure with hypoxia: Principal | ICD-10-CM

## 2018-09-03 DIAGNOSIS — Z515 Encounter for palliative care: Secondary | ICD-10-CM

## 2018-09-03 DIAGNOSIS — E440 Moderate protein-calorie malnutrition: Secondary | ICD-10-CM

## 2018-09-03 LAB — BLOOD GAS, ARTERIAL
Acid-base deficit: 2.3 mmol/L — ABNORMAL HIGH (ref 0.0–2.0)
Bicarbonate: 22.5 mmol/L (ref 20.0–28.0)
FIO2: 0.35
MECHVT: 400 mL
Mechanical Rate: 16
O2 Saturation: 97.6 %
Patient temperature: 37
pCO2 arterial: 38 mmHg (ref 32.0–48.0)
pH, Arterial: 7.38 (ref 7.350–7.450)
pO2, Arterial: 100 mmHg (ref 83.0–108.0)

## 2018-09-03 LAB — COMPREHENSIVE METABOLIC PANEL
ALT: 12 U/L (ref 0–44)
AST: 19 U/L (ref 15–41)
Albumin: 2.7 g/dL — ABNORMAL LOW (ref 3.5–5.0)
Alkaline Phosphatase: 101 U/L (ref 38–126)
Anion gap: 7 (ref 5–15)
BUN: 23 mg/dL (ref 8–23)
CO2: 24 mmol/L (ref 22–32)
Calcium: 8.6 mg/dL — ABNORMAL LOW (ref 8.9–10.3)
Chloride: 106 mmol/L (ref 98–111)
Creatinine, Ser: 0.91 mg/dL (ref 0.44–1.00)
GFR calc Af Amer: >60 mL/min (ref >60)
GFR calc non Af Amer: 54 mL/min — ABNORMAL LOW (ref >60)
Glucose, Bld: 161 mg/dL — ABNORMAL HIGH (ref 70–99)
Potassium: 4.2 mmol/L (ref 3.5–5.1)
SODIUM: 137 mmol/L (ref 135–145)
Total Bilirubin: 0.4 mg/dL (ref 0.3–1.2)
Total Protein: 5.7 g/dL — ABNORMAL LOW (ref 6.5–8.1)

## 2018-09-03 LAB — GLUCOSE, CAPILLARY
GLUCOSE-CAPILLARY: 311 mg/dL — AB (ref 70–99)
Glucose-Capillary: 310 mg/dL — ABNORMAL HIGH (ref 70–99)
Glucose-Capillary: 256 mg/dL — ABNORMAL HIGH (ref 70–99)
Glucose-Capillary: 213 mg/dL — ABNORMAL HIGH (ref 70–99)
Glucose-Capillary: 184 mg/dL — ABNORMAL HIGH (ref 70–99)
Glucose-Capillary: 162 mg/dL — ABNORMAL HIGH (ref 70–99)
Glucose-Capillary: 77 mg/dL (ref 70–99)

## 2018-09-03 LAB — CBC
HEMATOCRIT: 36.2 % (ref 36.0–46.0)
HEMOGLOBIN: 11.2 g/dL — AB (ref 12.0–15.0)
MCH: 28.9 pg (ref 26.0–34.0)
MCHC: 30.9 g/dL (ref 30.0–36.0)
MCV: 93.3 fL (ref 80.0–100.0)
Platelets: 197 10*3/uL (ref 150–400)
RBC: 3.88 MIL/uL (ref 3.87–5.11)
RDW: 13.7 % (ref 11.5–15.5)
WBC: 7.7 10*3/uL (ref 4.0–10.5)
nRBC: 0 % (ref 0.0–0.2)

## 2018-09-03 LAB — MAGNESIUM: Magnesium: 1.9 mg/dL (ref 1.7–2.4)

## 2018-09-03 LAB — MRSA PCR SCREENING: MRSA by PCR: NEGATIVE

## 2018-09-03 LAB — BASIC METABOLIC PANEL
Anion gap: 8 (ref 5–15)
BUN: 19 mg/dL (ref 8–23)
CHLORIDE: 106 mmol/L (ref 98–111)
CO2: 22 mmol/L (ref 22–32)
Calcium: 8.3 mg/dL — ABNORMAL LOW (ref 8.9–10.3)
Creatinine, Ser: 0.95 mg/dL (ref 0.44–1.00)
GFR calc Af Amer: 59 mL/min — ABNORMAL LOW (ref >60)
GFR calc non Af Amer: 51 mL/min — ABNORMAL LOW (ref >60)
GLUCOSE: 364 mg/dL — AB (ref 70–99)
Potassium: 4.9 mmol/L (ref 3.5–5.1)
Sodium: 136 mmol/L (ref 135–145)

## 2018-09-03 LAB — PROCALCITONIN: Procalcitonin: <0.1 ng/mL

## 2018-09-03 LAB — PHOSPHORUS: Phosphorus: 4.4 mg/dL (ref 2.5–4.6)

## 2018-09-03 LAB — LACTIC ACID, PLASMA: LACTIC ACID, VENOUS: 1.3 mmol/L (ref 0.5–1.9)

## 2018-09-03 LAB — BRAIN NATRIURETIC PEPTIDE: B Natriuretic Peptide: 530 pg/mL — ABNORMAL HIGH (ref 0.0–100.0)

## 2018-09-03 MED ORDER — PHENYLEPHRINE HCL-NACL 10-0.9 MG/250ML-% IV SOLN
0.0000 ug/min | INTRAVENOUS | Status: DC
  Administered 2018-09-03: 20 ug/min via INTRAVENOUS
  Administered 2018-09-04: 5 ug/min via INTRAVENOUS
  Administered 2018-09-05: 30 ug/min via INTRAVENOUS
  Filled 2018-09-03 (×5): qty 250

## 2018-09-03 MED ORDER — FUROSEMIDE 10 MG/ML IJ SOLN
20.0000 mg | Freq: Once | INTRAMUSCULAR | Status: AC
  Administered 2018-09-04: 20 mg via INTRAVENOUS
  Filled 2018-09-03 (×2): qty 2

## 2018-09-03 MED ORDER — METOPROLOL TARTRATE 5 MG/5ML IV SOLN
2.5000 mg | Freq: Once | INTRAVENOUS | Status: AC
  Administered 2018-09-03: 2.5 mg via INTRAVENOUS

## 2018-09-03 MED ORDER — IPRATROPIUM-ALBUTEROL 0.5-2.5 (3) MG/3ML IN SOLN
3.0000 mL | RESPIRATORY_TRACT | Status: DC | PRN
  Filled 2018-09-03: qty 3

## 2018-09-03 MED ORDER — CHLORHEXIDINE GLUCONATE 0.12% ORAL RINSE (MEDLINE KIT)
15.0000 mL | Freq: Two times a day (BID) | OROMUCOSAL | Status: DC
  Administered 2018-09-03 – 2018-09-08 (×11): 15 mL via OROMUCOSAL

## 2018-09-03 MED ORDER — METOPROLOL TARTRATE 5 MG/5ML IV SOLN
5.0000 mg | Freq: Four times a day (QID) | INTRAVENOUS | Status: DC | PRN
  Administered 2018-09-06: 5 mg via INTRAVENOUS
  Filled 2018-09-03: qty 5

## 2018-09-03 MED ORDER — FAMOTIDINE 20 MG PO TABS
20.0000 mg | ORAL_TABLET | Freq: Every day | ORAL | Status: DC
  Administered 2018-09-03 – 2018-09-07 (×5): 20 mg
  Filled 2018-09-03 (×6): qty 1

## 2018-09-03 MED ORDER — BISACODYL 10 MG RE SUPP: 10.0000 mg | Freq: Every day | RECTAL | Status: DC | PRN

## 2018-09-03 MED ORDER — INSULIN ASPART 100 UNIT/ML ~~LOC~~ SOLN
10.0000 [IU] | Freq: Once | SUBCUTANEOUS | Status: AC
  Administered 2018-09-03: 10 [IU] via SUBCUTANEOUS
  Filled 2018-09-03: qty 1

## 2018-09-03 MED ORDER — FUROSEMIDE 10 MG/ML IJ SOLN
20.0000 mg | Freq: Once | INTRAMUSCULAR | Status: AC
  Administered 2018-09-03: 20 mg via INTRAVENOUS
  Filled 2018-09-03: qty 2

## 2018-09-03 MED ORDER — INSULIN ASPART 100 UNIT/ML ~~LOC~~ SOLN
0.0000 [IU] | SUBCUTANEOUS | Status: DC
  Administered 2018-09-03: 2 [IU] via SUBCUTANEOUS
  Administered 2018-09-03: 5 [IU] via SUBCUTANEOUS
  Administered 2018-09-03: 2 [IU] via SUBCUTANEOUS
  Administered 2018-09-03 – 2018-09-06 (×2): 3 [IU] via SUBCUTANEOUS
  Administered 2018-09-06 – 2018-09-07 (×3): 2 [IU] via SUBCUTANEOUS
  Administered 2018-09-07: 5 [IU] via SUBCUTANEOUS
  Administered 2018-09-07: 2 [IU] via SUBCUTANEOUS
  Administered 2018-09-07: 3 [IU] via SUBCUTANEOUS
  Administered 2018-09-07: 2 [IU] via SUBCUTANEOUS
  Administered 2018-09-07: 5 [IU] via SUBCUTANEOUS
  Filled 2018-09-03 (×13): qty 1

## 2018-09-03 MED ORDER — VANCOMYCIN HCL IN DEXTROSE 1-5 GM/200ML-% IV SOLN
1000.0000 mg | Freq: Once | INTRAVENOUS | Status: AC
  Administered 2018-09-03: 1000 mg via INTRAVENOUS
  Filled 2018-09-03: qty 200

## 2018-09-03 MED ORDER — SODIUM CHLORIDE 0.9 % IV SOLN
2.0000 g | INTRAVENOUS | Status: DC
  Administered 2018-09-03: 2 g via INTRAVENOUS
  Filled 2018-09-03 (×2): qty 2

## 2018-09-03 MED ORDER — VANCOMYCIN HCL IN DEXTROSE 1-5 GM/200ML-% IV SOLN
1000.0000 mg | INTRAVENOUS | Status: DC
  Filled 2018-09-03: qty 200

## 2018-09-03 MED ORDER — SODIUM CHLORIDE 0.9 % IV SOLN
1.0000 g | INTRAVENOUS | Status: DC
  Administered 2018-09-03 – 2018-09-05 (×3): 1 g via INTRAVENOUS
  Filled 2018-09-03 (×3): qty 1

## 2018-09-03 MED ORDER — POLYETHYLENE GLYCOL 3350 17 G PO PACK
17.0000 g | PACK | Freq: Every day | ORAL | Status: DC
  Administered 2018-09-03 – 2018-09-07 (×4): 17 g via NASOGASTRIC
  Filled 2018-09-03 (×5): qty 1

## 2018-09-03 MED ORDER — ORAL CARE MOUTH RINSE
15.0000 mL | OROMUCOSAL | Status: DC
  Administered 2018-09-03 – 2018-09-08 (×48): 15 mL via OROMUCOSAL

## 2018-09-03 NOTE — Progress Notes (Signed)
   09/03/18 0640  Clinical Encounter Type  Visited With Other (Comment)  Visit Type Initial;Spiritual support  Referral From Nurse  Consult/Referral To Chaplain  Spiritual Encounters  Spiritual Needs Other (Comment)   CH received a OR to assist the patient in completing a AD. After reviewing the chart notes it was determined that Ms. Dana Griffith has dementia and will be unable to complete the AD at this time.

## 2018-09-03 NOTE — Progress Notes (Signed)
Initial Nutrition Assessment  DOCUMENTATION CODES:   Non-severe (moderate) malnutrition in context of chronic illness  INTERVENTION:  If patient remains intubated >24-48 hrs and continues with full scope of care, recommend initiating Vital AF 1.2 at 40 mL/hr (960 mL goal daily volume). Provides 1152 kcal, 52 grams of protein, 778 mL H2O.  If tube feeds are initiated recommend providing a minimum free water flush of 30 mL Q4hrs to maintain tube patency and liquid MVI daily per tube to meet 100% RDIs for vitamins/minerals.  Patient will be at risk for refeeding with initiation of tube feeds or dextrose-containing fluids. Recommend monitoring potassium, magnesium, and phosphorus daily after initiation and replacing as needed.  NUTRITION DIAGNOSIS:   Moderate Malnutrition related to chronic illness(dementia, advanced age) as evidenced by moderate fat depletion, mild-moderate muscle depletion.  GOAL:   Provide needs based on ASPEN/SCCM guidelines  MONITOR:   Vent status, Labs, Weight trends, Skin, I & O's  REASON FOR ASSESSMENT:   Ventilator    ASSESSMENT:   84 year old female with PMHx of A-fib, DM, hx TIA, dementia admitted with acute hypoxic respiratory failure requiring intubation on 12/26 in setting of PNA, bilateral pleural effusions, and also with A-fib with RVR.   Patient intubated and sedated. On PRVC mode with FiO2 35% and PEEP 5 cmH2O. Abdomen soft. Last BM unknown/PTA. Patient was 47.6 kg in 2018. Currently 55.2 kg (121.69 lbs).  Enteral Access: 18 Fr. OGT placed 12/26; terminates in stomach per abdominal x-ray 12/26; 51 cm at mouth  MAP: 72-112 mmHg  Patient is currently intubated on ventilator support Ve: 6.4 L/min Temp (24hrs), Avg:98 F (36.7 C), Min:97.5 F (36.4 C), Max:98.8 F (37.1 C)  Propofol: 1.57 mL/hr (41 kcal daily)  Medications reviewed and include: famotidine 20 mg daily per tube, Novolog 0-9 units Q4hrs, ceftriaxone, fentanyl gtt, propofol  gtt.  Labs reviewed: CBG 213-311.  Discussed with RN and on rounds.  NUTRITION - FOCUSED PHYSICAL EXAM:    Most Recent Value  Orbital Region  Moderate depletion  Upper Arm Region  Moderate depletion  Thoracic and Lumbar Region  Mild depletion  Buccal Region  Unable to assess  Temple Region  Severe depletion  Clavicle Bone Region  Moderate depletion  Clavicle and Acromion Bone Region  Moderate depletion  Scapular Bone Region  Unable to assess  Dorsal Hand  Moderate depletion  Patellar Region  Mild depletion  Anterior Thigh Region  Mild depletion  Posterior Calf Region  Mild depletion  Edema (RD Assessment)  Mild  Hair  Reviewed  Eyes  Unable to assess  Mouth  Unable to assess  Skin  Reviewed  Nails  Reviewed     Diet Order:   Diet Order            Diet NPO time specified  Diet effective now             EDUCATION NEEDS:   No education needs have been identified at this time  Skin:  Skin Assessment: Skin Integrity Issues:(nonpressure wound to right leg)  Last BM:  Unknown/PTA  Height:   Ht Readings from Last 1 Encounters:  09/03/18 5\' 2"  (1.575 m)   Weight:   Wt Readings from Last 1 Encounters:  09/03/18 55.2 kg   Ideal Body Weight:  50 kg  BMI:  Body mass index is 22.26 kg/m.  Estimated Nutritional Needs:   Kcal:  1058 (PSU 2003b w/ MSJ 912, Ve 6.4, Tmax 37.1)  Protein:  65-85 grams (1.2-1.5 grams/kg)  Fluid:  1.4 L/day (25 mL/kg)  Helane RimaLeanne Tenoch Mcclure, MS, RD, LDN Office: [phone] Pager: [phone] After Hours/Weekend Pager: [phone]

## 2018-09-03 NOTE — Progress Notes (Signed)
PHARMACIST - PHYSICIAN COMMUNICATION  CONCERNING: IV to Oral Route Change Policy  RECOMMENDATION: This patient is receiving famotidine by the intravenous route.  Based on criteria approved by the Pharmacy and Therapeutics Committee, the intravenous medication(s) is/are being converted to the equivalent oral dose form(s).   DESCRIPTION: These criteria include:  The patient is eating (either orally or via tube) and/or has been taking other orally administered medications for a least 24 hours  The patient has no evidence of active gastrointestinal bleeding or impaired GI absorption (gastrectomy, short bowel, patient on TNA or NPO).  If you have questions about this conversion, please contact the Pharmacy Department  []   [phone]( [phone] )  [facility]  Simpson,Michael L, [facility], IncRPH 09/03/2018 10:25 AM

## 2018-09-03 NOTE — Progress Notes (Signed)
Pharmacy Antibiotic Note  Dana MansonMargaret H Wolke is a 84 y.o. female admitted on 08/08/2018 with pneumonia.  Pharmacy has been consulted for vancomycin and cefepime dosing.  Plan: DW 55kg  Vd 35L kei 0.028 hr-1  T1/2 25 hours Vancomycin 1 gram q 36 hours ordered. Level before 4th dose. Goal trough 15-20  Cefepime 2 grams q 24 hours ordered  Height: 5\' 2"  (157.5 cm) Weight: 121 lb 11.1 oz (55.2 kg) IBW/kg (Calculated) : 50.1  Temp (24hrs), Avg:97.6 F (36.4 C), Min:97.5 F (36.4 C), Max:97.7 F (36.5 C)  Recent Labs  Lab 08/23/2018 1826 09/03/18 0243  WBC 9.9 7.7  CREATININE 0.94 0.95    Estimated Creatinine Clearance: 28.6 mL/min (by C-G formula based on SCr of 0.95 mg/dL).    Allergies  Allergen Reactions  . Niaspan [Niacin Er] Other (See Comments)    unknown    Antimicrobials this admission: Vancomycin, cefepime 12/27  >>    >>   Dose adjustments this admission:   Microbiology results: 12/27 Sputum: pending  12/27 MRSA PCR: (-)  Thank you for allowing pharmacy to be a part of this patient's care.  Simaya Lumadue S 09/03/2018 4:37 AM

## 2018-09-03 NOTE — Progress Notes (Signed)
   09/03/18 2100  Clinical Encounter Type  Visited With Family;Patient and family together  Visit Type Follow-up  Referral From Chaplain  Spiritual Encounters  Spiritual Needs Prayer;Emotional;Grief support;Other (Comment)  Stress Factors  Family Stress Factors Exhausted;Loss;Major life changes;Other (Comment)

## 2018-09-03 NOTE — Progress Notes (Signed)
Inpatient Diabetes Program Recommendations  AACE/ADA: New Consensus Statement on Inpatient Glycemic Control (2015)  Target Ranges:  Prepandial:   less than 140 mg/dL      Peak postprandial:   less than 180 mg/dL (1-2 hours)      Critically ill patients:  140 - 180 mg/dL   Results for Dana Griffith, Dana H "HELEN" (MRN [id number]) as of 09/03/2018 09:56  Ref. Range 09/03/2018 01:59 09/03/2018 03:30 09/03/2018 07:43  Glucose-Capillary Latest Ref Range: 70 - 99 mg/dL 784311 (H) 696310 (H)  10 units NOVOLOG  256 (H)  5 units NOVOLOG     Admit with: Acute respiratory failure with hypoxia due to pneumonia, pleural effusions, and AFib with RVR  History: DM, Dementia  Home DM Meds: Humulin Regular Insulin 8 units QID  Current Orders: Novolog Sensitive Correction Scale/ SSI (0-9 units) Q4 hours      Currently Intubated.  Started Novolog SSI last night at 3am.  CBGs slowly coming down with addition of Novolog.  May require more Insulin but would like to see more CBG data before making further recommendations.     --Will follow patient during hospitalization--  Ambrose FinlandJeannine Johnston Samira Acero RN, MSN, CDE Diabetes Coordinator Inpatient Glycemic Control Team Team Pager: [phone] (8a-5p)

## 2018-09-03 NOTE — Progress Notes (Signed)
   09/03/18 1335  Clinical Encounter Type  Visited With Patient and family together  Visit Type Follow-up  Referral From Nurse  Recommendations follow throughout the day  [place] responded to OR for AD.  Per chart, patient's baseline is confused.  Patient is intubated and sedated.  Chaplain met with son Herbie [place], offering emotional support and providing education on ongoing chaplain support.  Robert requested chaplain offer a prayer which she did.  Chaplain recommends chaplain follow up throughout the day.  Chaplain closed order for AD.

## 2018-09-03 NOTE — Consult Note (Signed)
[facility]  [phone] [phone]   Name: Dana Griffith Date: 09/03/2018 MRN: [id number]  DOB: [birth year]  Patient Care Team: Patient, No Pcp Per as PCP - General (General Practice) System, Pcp Not In    REASON FOR CONSULTATION: Palliative Care consult requested for this 84 y.o. female with multiple medical problems including mild dementia, afib not on anticoagulation, h/o TIA, h/o CHF, and DM. Patient was admitted to the hosptial on 09/01/2018 with VDRF secondary to PNA and afib with RVR. CT of the chest revealed RUL consolidation and B. Pleural effusions (R>L). Patient apparently had been treated in the outpatient setting for PNA but had clinically declined. Palliative care was consulted to help address goals.   SOCIAL HISTORY:    Patient is widowed. She lives at home with her daughter in [place]. However, patient is from [place] and still resides there with family part of the year. She also has a son and multiple grandchildren who are involved in her care.   ADVANCE DIRECTIVES:  Not on file. Patient's son is reportedly her 54.  CODE STATUS: Full code  PAST MEDICAL HISTORY: Past Medical History:  Diagnosis Date  . Atrial fibrillation ([place])   . Dementia ([place])   . Diabetes mellitus without complication ([place])   . Hyperlipemia   . TIA (transient ischemic attack)     PAST SURGICAL HISTORY:  Past Surgical History:  Procedure Laterality Date  . CATARACT EXTRACTION Bilateral   . CHOLECYSTECTOMY      HEMATOLOGY/ONCOLOGY HISTORY:   No history exists.    ALLERGIES:  is allergic to niaspan [niacin er].  MEDICATIONS:  Current Facility-Administered Medications  Medication Dose Route Frequency Provider Last Rate Last Dose  . cefTRIAXone (ROCEPHIN) 1 g in sodium chloride 0.9 % 100 mL IVPB  1 g Intravenous Q24H Tyler Pita, MD      . chlorhexidine gluconate (MEDLINE KIT) (PERIDEX) 0.12 % solution 15 mL   15 mL Mouth Rinse BID Darel Hong D, NP   15 mL at 09/03/18 0823  . enoxaparin (LOVENOX) injection 30 mg  30 mg Subcutaneous Q24H Lance Coon, MD      . famotidine (PEPCID) tablet 20 mg  20 mg Per Tube Daily Charlett Nose, RPH   20 mg at 09/03/18 1032  . fentaNYL 2566mg in NS 2574m(1032mml) infusion-PREMIX  0-400 mcg/hr Intravenous Continuous WilLance CoonD 5 mL/hr at 09/03/18 1200 50 mcg/hr at 09/03/18 1200  . insulin aspart (novoLOG) injection 0-9 Units  0-9 Units Subcutaneous Q4H KeeDarel Hong NP   5 Units at 09/03/18 [phone] ipratropium-albuterol (DUONEB) 0.5-2.5 (3) MG/3ML nebulizer solution 3 mL  3 mL Nebulization Q4H PRN KeeDarel Hong NP      . MEDLINE mouth rinse  15 mL Mouth Rinse 10 times per day KeeBradly BienenstockP   15 mL at 09/03/18 1026  . ondansetron (ZOFRAN) injection 4 mg  4 mg Intravenous Q6H PRN WilLance CoonD      . propofol (DIPRIVAN) 1000 MG/100ML infusion  5-80 mcg/kg/min Intravenous Titrated WilLance CoonD 3.13 mL/hr at 09/03/18 1200 10 mcg/kg/min at 09/03/18 1200    VITAL SIGNS: BP 98/73   Pulse 85   Temp 97.9 F (36.6 C) (Oral)   Resp 16   Ht '5\' 2"'  (1.575 m)   Wt 121 lb 11.1 oz (55.2 kg)   SpO2 100%   BMI 22.26 kg/m  Filed Weights   08/12/2018 1753 09/03/18  0212  Weight: 115 lb (52.2 kg) 121 lb 11.1 oz (55.2 kg)    Estimated body mass index is 22.26 kg/m as calculated from the following:   Height as of this encounter: '5\' 2"'  (1.575 m).   Weight as of this encounter: 121 lb 11.1 oz (55.2 kg).  LABS: CBC:    Component Value Date/Time   WBC 7.7 09/03/2018 0243   HGB 11.2 (L) 09/03/2018 0243   HCT 36.2 09/03/2018 0243   PLT 197 09/03/2018 0243   MCV 93.3 09/03/2018 0243   NEUTROABS 8.3 (H) 09/05/2018 1826   LYMPHSABS 0.9 09/04/2018 1826   MONOABS 0.6 08/31/2018 1826   EOSABS 0.1 09/06/2018 1826   BASOSABS 0.0 08/19/2018 1826   Comprehensive Metabolic Panel:    Component Value Date/Time   NA 136 09/03/2018 0243    K 4.9 09/03/2018 0243   CL 106 09/03/2018 0243   CO2 22 09/03/2018 0243   BUN 19 09/03/2018 0243   CREATININE 0.95 09/03/2018 0243   GLUCOSE 364 (H) 09/03/2018 0243   CALCIUM 8.3 (L) 09/03/2018 0243   AST 21 08/25/2018 1826   ALT 12 08/28/2018 1826   ALKPHOS 116 08/18/2018 1826   BILITOT 0.5 09/01/2018 1826   PROT 7.1 08/24/2018 1826   ALBUMIN 3.6 09/01/2018 1826    RADIOGRAPHIC STUDIES: Ct Chest Wo Contrast  Result Date: 08/29/2018 CLINICAL DATA:  Dyspnea cough since Thanksgiving. EXAM: CT CHEST WITHOUT CONTRAST TECHNIQUE: Multidetector CT imaging of the chest was performed following the standard protocol without IV contrast. COMPARISON:  CXR 09/05/2018 and 09/15/2010 FINDINGS: Cardiovascular: Cardiomegaly with aortic atherosclerosis is noted with left main and three-vessel coronary arteriosclerosis. No pericardial effusion or thickening. The thoracic aorta is nonaneurysmal. Unenhanced pulmonary vasculature is unremarkable. Mediastinum/Nodes: No thyromegaly or mass. Atherosclerosis of the great vessels. Small subcentimeter short axis mediastinal nodes are noted. Assessment for hilar adenopathy is limited due to lack of IV contrast. Small anterior chest wall hypodense subcutaneous nodule likely representing small sebaceous cyst is noted measuring up to 2.2 cm. Lungs/Pleura: Left greater than right moderate pleural effusions with adjacent compressive atelectasis. Respiratory motion artifacts limit assessment. Subtle nodular density in the left upper lobe measuring 4 mm, series 3/45 is identified. Posterior segment right upper lobe pulmonary consolidation is visualized, series 3/59 with air bronchograms. Atelectasis or pneumonia might account for this appearance. No pneumothorax is seen. Upper Abdomen: No acute abnormality. Musculoskeletal: Thoracic spondylosis withage-indeterminate likely chronic mild inferior endplate compression of T8 and superior endplate of Z61. IMPRESSION: 1. Moderate left  greater than right pleural effusions with adjacent compressive atelectasis. 2. Posterior segment right upper lobe pulmonary consolidation with air bronchograms may represent atelectasis or pneumonia. 3. 4 mm nodular density in the left upper lobe. No follow-up needed if patient is low-risk. Non-contrast chest CT can be considered in 12 months if patient is high-risk. This recommendation follows the consensus statement: Guidelines for Management of Incidental Pulmonary Nodules Detected on CT Images: From the Fleischner Society 2017; Radiology 2017; 284:228-243. 4. Thoracic spondylosis. Mild inferior endplate compression of T8 and superior endplate compression W96 likely remote. No retropulsion. Aortic Atherosclerosis (ICD10-I70.0). Electronically Signed   By: Ashley Royalty M.D.   On: 08/27/2018 20:09   Portable Chest Xray  Result Date: 09/03/2018 CLINICAL DATA:  Acute respiratory failure EXAM: PORTABLE CHEST 1 VIEW COMPARISON:  09/02/2017 FINDINGS: Endotracheal tube terminates 2.5 cm above the carina. No frank interstitial edema. Suspected small bilateral pleural effusions, right greater than left. No pneumothorax. Cardiomegaly. Degenerative changes of the thoracic  spine. IMPRESSION: Endotracheal tube terminates 2.5 cm above the carina. Suspected small bilateral pleural effusions, right greater than left. Electronically Signed   By: Julian Hy M.D.   On: 09/03/2018 05:48   Dg Chest Portable 1 View  Result Date: 09/07/2018 CLINICAL DATA:  Respiratory failure. Status post intubation. EXAM: PORTABLE CHEST 1 VIEW COMPARISON:  Same day chest CT and CXR FINDINGS: New endotracheal tube tip is identified approximately 3.2 cm above the carina in satisfactory position. Right greater than left pleural effusions with atelectasis and interstitial edema is noted. Aortic atherosclerosis is identified. No acute osseous appearing abnormality. IMPRESSION: 1. Satisfactory endotracheal tube position. 2. Stable  cardiomegaly and aortic atherosclerosis. 3. Right greater than left pleural effusions with atelectasis and interstitial edema. Electronically Signed   By: Ashley Royalty M.D.   On: 09/01/2018 21:58   Dg Chest Portable 1 View  Result Date: 08/29/2018 CLINICAL DATA:  Hypoxia, shortness of Breath EXAM: PORTABLE CHEST 1 VIEW COMPARISON:  07/17/2017 FINDINGS: Cardiomegaly with vascular congestion. Moderate bilateral pleural effusions with bibasilar atelectasis or infiltrates. No acute bony abnormality. IMPRESSION: Cardiomegaly with vascular congestion. Moderate effusions with bibasilar atelectasis or pneumonia. Electronically Signed   By: Rolm Baptise M.D.   On: 08/19/2018 18:14   Dg Abd Portable 1 View  Result Date: 08/16/2018 CLINICAL DATA:  Check gastric catheter placement EXAM: PORTABLE ABDOMEN - 1 VIEW COMPARISON:  Chest x-ray from earlier in the same day. FINDINGS: Gastric catheter is noted within the stomach. Bilateral pleural effusions are again noted similar to that seen on recent chest x-ray. Degenerative changes of the lumbar spine is noted. IMPRESSION: Gastric catheter within the stomach. Bilateral pleural effusions. Electronically Signed   By: Inez Catalina M.D.   On: 08/13/2018 23:46    PERFORMANCE STATUS (ECOG) : 4 - Bedbound  Review of Systems As noted above. Otherwise, a complete review of systems is negative.  Physical Exam General: ill appearing Cardiovascular: irregular, systolic murmur Pulmonary: clear ant fields, on ventilator Abdomen: soft, nontender, + bowel sounds Extremities: trace edema B. LEs Skin: no rashes Neurological: sedated  IMPRESSION: Patient remains in ICU on ventilator.  I met today with patient's daughter and granddaughter.  Patient's son, who is reportedly her healthcare power of attorney is coming to the hospital later this afternoon.  However, family say they will make decisions collectively.  At baseline, family say that patient is ambulatory with  assistance.  She has a walker in the home but refuses to use it.  Patient has had multiple falls over the last few months but has not sustained serious injury other than skin tears.  Patient requires assistance with bathing and dressing.  She is confused at baseline but recognizes family.  All of patient's medical care has been in [place].  Family had been trying to establish a PCP locally but was told it would be February before they could get her into their practice.  Family hope to establish care with Dr. Ouida Sills in [facility].  They had also been trying to get home health services arranged but were having significant difficulty doing so.  Together we reviewed patient's current medical problems.  Family say they recognize that she is critically ill.  However, they remain hopeful that she will improve and be extubated in a few days.  They are in agreement with current scope of care.    Family say their primary goal will be to take her home following this hospitalization.  They recognize that patient might not return to  her previous functional baseline.  We discussed the possible involvement of hospice in her care and family stated they would not be opposed to that.  We discussed CODE STATUS at length.  Patient's granddaughter and other family members work in healthcare and were familiar with this topic.  Patient had never communicated her wishes to family and family had not previously discussed these decisions.  Patient does not have a living will.  Family stated that they would like to talk more with each other prior to making a decision.   Family stated that patient would not likely want to be prolonged long term on a ventilator.  Granddaughter said that she suspected family would opt for less aggressive measures if it became apparent the patient was not making progress.  PLAN: Continue full scope of treatment Family are talking about decisions such as CODE STATUS Will follow    Time  Total: 60 minutes  Visit consisted of counseling and education dealing with the complex and emotionally intense issues of symptom management and palliative care in the setting of serious and potentially life-threatening illness.Greater than 50%  of this time was spent counseling and coordinating care related to the above assessment and plan.  Signed by: Altha Harm, PhD, NP-C [phone] (Work Cell)

## 2018-09-03 NOTE — Consult Note (Signed)
PULMONARY / CRITICAL CARE MEDICINE   Name: Dana Griffith MRN: [id number] DOB: 21-Sep-1921    ADMISSION DATE:  08/10/2018 CONSULTATION DATE:  08/15/2018  REFERRING MD:  Dr. Jannifer Franklin  CHIEF COMPLAINT:  Shortness of Breath  BRIEF DISCUSSION: 84 y.o. Female admitted with Acute Hypoxic Respiratory failure requiring intubation in the ED in the setting of CAP, bilateral pleural effusions, and  A-fib with RVR.  HISTORY OF PRESENT ILLNESS:   Dana Griffith is a 84 y.o. Female with a PMH of TIA, DM, A-fib, Dementia, and Hyperlipidemia who presents to [facility] ED on 09/04/2018 with c/o progressive shortness of breath and cough since around Thanksgiving while visiting relatives in [place].  Today she was wheezing with respiratory distress, therefore EMS was called.  Upon presentation to the ED she was noted to be hypoxic with O2 sats in the 70's.  Initial workup in the ED revealed WBC 9.9 and VBG: 7.32/ 59/34.  CT Chest was concerning for bilateral pleural effusions and RUL consolidation concerning for PNA.  Initially she improved with supplemental O2, but then developed acute respiratory distress during episode of A-fib with RVR, requiring intubation in the ED.  She is admitted to [facility] ICU for treatment of Acute Hypoxic Respiratory Failure in setting of CAP and bilateral pleural effusions, requiring intubation. Pt also with A-fib with RVR.  PCCM is consulted for further management.  PAST MEDICAL HISTORY :  She  has a past medical history of Atrial fibrillation ([place]), Dementia ([place]), Diabetes mellitus without complication ([place]), Hyperlipemia, and TIA (transient ischemic attack).  PAST SURGICAL HISTORY: She  has a past surgical history that includes Cholecystectomy and Cataract extraction (Bilateral).  Allergies  Allergen Reactions  . Niaspan [Niacin Er] Other (See Comments)    unknown    No current facility-administered medications on file prior to encounter.    Current Outpatient Medications on File  Prior to Encounter  Medication Sig  . aspirin EC 81 MG tablet Take 81 mg 2 (two) times daily by mouth.  Marland Kitchen atorvastatin (LIPITOR) 20 MG tablet Take 20 mg by mouth daily.  . Cholecalciferol (VITAMIN D3) 2000 units TABS Take 1 tablet daily by mouth.  . digoxin (LANOXIN) 0.125 MG tablet Take 125 mcg by mouth daily.  Marland Kitchen diltiazem (CARDIZEM SR) 60 MG 12 hr capsule Take 60 mg by mouth 2 (two) times daily.  Marland Kitchen escitalopram (LEXAPRO) 10 MG tablet Take 10 mg daily by mouth.  . fluticasone (FLONASE) 50 MCG/ACT nasal spray Place 1 spray 2 (two) times daily into both nostrils.  Marland Kitchen HUMULIN R 100 UNIT/ML injection Inject 8 Units 4 (four) times daily as needed into the skin.   Marland Kitchen HYDROcodone-acetaminophen (NORCO/VICODIN) 5-325 MG tablet Take 1 tablet by mouth every 6 (six) hours as needed for severe pain.  . isosorbide mononitrate (IMDUR) 30 MG 24 hr tablet Take 30 mg daily by mouth.  Marland Kitchen lisinopril (PRINIVIL,ZESTRIL) 10 MG tablet Take 10 mg by mouth daily.  . metoCLOPramide (REGLAN) 5 MG tablet Take 1 tablet 2 (two) times daily by mouth.   . Multiple Vitamin (MULTIVITAMIN) tablet Take 1 tablet daily by mouth.  . nitroGLYCERIN (NITROSTAT) 0.4 MG SL tablet Place 1 tablet under the tongue every 5 (five) minutes x 3 doses as needed.  Marland Kitchen omeprazole (PRILOSEC) 40 MG capsule Take 40 mg daily by mouth.  . ondansetron (ZOFRAN-ODT) 4 MG disintegrating tablet Take 4 mg by mouth every 6 (six) hours.  . sodium chloride 1 g tablet Take 1 g 2 (two) times daily with  a meal by mouth.    FAMILY HISTORY:  Her has no family status information on file.    SOCIAL HISTORY: She  reports that she has never smoked. She has never used smokeless tobacco. She reports that she does not drink alcohol or use drugs.  REVIEW OF SYSTEMS:   Unable to obtain due to intubation and sedation    SUBJECTIVE:  Unable to obtain due to intubation and sedation  VITAL SIGNS: BP (!) 115/93 (BP Location: Right Arm)   Pulse (!) 101   Temp (!) 97.5 F  (36.4 C) (Axillary)   Resp 15   Ht '5\' 2"'  (1.575 m)   Wt 55.2 kg   SpO2 99%   BMI 22.26 kg/m   HEMODYNAMICS:    VENTILATOR SETTINGS: Vent Mode: AC FiO2 (%):  [35 %-50 %] 35 % Set Rate:  [16 bmp] 16 bmp Vt Set:  [400 mL] 400 mL PEEP:  [5 cmH20] 5 cmH20  INTAKE / OUTPUT: No intake/output data recorded.  PHYSICAL EXAMINATION: General:  Acutely ill appearing female, laying in bed, intubated, sedated, in NAD Neuro:  Sedated, withdraws from pain, Pupils PERRL 3 mm sluggish bilaterally HEENT:  Atraumatic, normocephalic, neck supple, no JVD, ETT in place Cardiovascular:  Irregulary irregular rhythm, rate controlled, no M/R/G Lungs:  Clear to auscultation bilareally, even, nonlabored, vent assisted Abdomen:  Soft, nontender, nondistended, no rebound tenderness or guarding, BS+ x4 Musculoskeletal:  No deformities, normal bulk and tone, 1+ edema bilateral LE Skin:  Warm/dry.  No obvious rashes, lesions, or ulcerations  LABS:  BMET Recent Labs  Lab 09/06/2018 1826  NA 137  K 4.3  CL 103  CO2 29  BUN 18  CREATININE 0.94  GLUCOSE 235*    Electrolytes Recent Labs  Lab 08/27/2018 1826  CALCIUM 9.0    CBC Recent Labs  Lab 08/23/2018 1826  WBC 9.9  HGB 11.5*  HCT 37.1  PLT 216    Coag's No results for input(s): APTT, INR in the last 168 hours.  Sepsis Markers No results for input(s): LATICACIDVEN, PROCALCITON, O2SATVEN in the last 168 hours.  ABG No results for input(s): PHART, PCO2ART, PO2ART in the last 168 hours.  Liver Enzymes Recent Labs  Lab 09/04/2018 1826  AST 21  ALT 12  ALKPHOS 116  BILITOT 0.5  ALBUMIN 3.6    Cardiac Enzymes No results for input(s): TROPONINI, PROBNP in the last 168 hours.  Glucose Recent Labs  Lab 09/03/18 0159  GLUCAP 311*    Imaging Ct Chest Wo Contrast  Result Date: 09/04/2018 CLINICAL DATA:  Dyspnea cough since Thanksgiving. EXAM: CT CHEST WITHOUT CONTRAST TECHNIQUE: Multidetector CT imaging of the chest was  performed following the standard protocol without IV contrast. COMPARISON:  CXR 09/01/2018 and 09/15/2010 FINDINGS: Cardiovascular: Cardiomegaly with aortic atherosclerosis is noted with left main and three-vessel coronary arteriosclerosis. No pericardial effusion or thickening. The thoracic aorta is nonaneurysmal. Unenhanced pulmonary vasculature is unremarkable. Mediastinum/Nodes: No thyromegaly or mass. Atherosclerosis of the great vessels. Small subcentimeter short axis mediastinal nodes are noted. Assessment for hilar adenopathy is limited due to lack of IV contrast. Small anterior chest wall hypodense subcutaneous nodule likely representing small sebaceous cyst is noted measuring up to 2.2 cm. Lungs/Pleura: Left greater than right moderate pleural effusions with adjacent compressive atelectasis. Respiratory motion artifacts limit assessment. Subtle nodular density in the left upper lobe measuring 4 mm, series 3/45 is identified. Posterior segment right upper lobe pulmonary consolidation is visualized, series 3/59 with air bronchograms. Atelectasis or pneumonia  might account for this appearance. No pneumothorax is seen. Upper Abdomen: No acute abnormality. Musculoskeletal: Thoracic spondylosis withage-indeterminate likely chronic mild inferior endplate compression of T8 and superior endplate of U98. IMPRESSION: 1. Moderate left greater than right pleural effusions with adjacent compressive atelectasis. 2. Posterior segment right upper lobe pulmonary consolidation with air bronchograms may represent atelectasis or pneumonia. 3. 4 mm nodular density in the left upper lobe. No follow-up needed if patient is low-risk. Non-contrast chest CT can be considered in 12 months if patient is high-risk. This recommendation follows the consensus statement: Guidelines for Management of Incidental Pulmonary Nodules Detected on CT Images: From the Fleischner Society 2017; Radiology 2017; 284:228-243. 4. Thoracic spondylosis.  Mild inferior endplate compression of T8 and superior endplate compression J19 likely remote. No retropulsion. Aortic Atherosclerosis (ICD10-I70.0). Electronically Signed   By: Ashley Royalty M.D.   On: 08/11/2018 20:09   Dg Chest Portable 1 View  Result Date: 08/19/2018 CLINICAL DATA:  Respiratory failure. Status post intubation. EXAM: PORTABLE CHEST 1 VIEW COMPARISON:  Same day chest CT and CXR FINDINGS: New endotracheal tube tip is identified approximately 3.2 cm above the carina in satisfactory position. Right greater than left pleural effusions with atelectasis and interstitial edema is noted. Aortic atherosclerosis is identified. No acute osseous appearing abnormality. IMPRESSION: 1. Satisfactory endotracheal tube position. 2. Stable cardiomegaly and aortic atherosclerosis. 3. Right greater than left pleural effusions with atelectasis and interstitial edema. Electronically Signed   By: Ashley Royalty M.D.   On: 08/14/2018 21:58   Dg Chest Portable 1 View  Result Date: 08/10/2018 CLINICAL DATA:  Hypoxia, shortness of Breath EXAM: PORTABLE CHEST 1 VIEW COMPARISON:  07/17/2017 FINDINGS: Cardiomegaly with vascular congestion. Moderate bilateral pleural effusions with bibasilar atelectasis or infiltrates. No acute bony abnormality. IMPRESSION: Cardiomegaly with vascular congestion. Moderate effusions with bibasilar atelectasis or pneumonia. Electronically Signed   By: Rolm Baptise M.D.   On: 08/27/2018 18:14   Dg Abd Portable 1 View  Result Date: 08/17/2018 CLINICAL DATA:  Check gastric catheter placement EXAM: PORTABLE ABDOMEN - 1 VIEW COMPARISON:  Chest x-ray from earlier in the same day. FINDINGS: Gastric catheter is noted within the stomach. Bilateral pleural effusions are again noted similar to that seen on recent chest x-ray. Degenerative changes of the lumbar spine is noted. IMPRESSION: Gastric catheter within the stomach. Bilateral pleural effusions. Electronically Signed   By: Inez Catalina M.D.    On: 09/04/2018 23:46     STUDIES:  CT Chest 12/26>> bilateral pleural effusions (L>R), RUL pulmonary consolidation concerning for atelectasis vs PNA, 4 mm nodular density in LUL Echocardiogram 12/27>>  CULTURES: Influenza PCR 12/26>>negative Tracheal aspirate 12/27>> Strep pneumo urinary antigen 12/27>> Legionella urinary antigen 12/27>>  ANTIBIOTICS: Cefepime 12/26>> Vancomycin 12/27>> Azithromycin 12/26 x1 dose Rocephin 12/26 x1 dose  SIGNIFICANT EVENTS: 08/10/2018>> Admission to [facility] ICU  LINES/TUBES: ETT 08/31/2018>>   ASSESSMENT / PLAN:  PULMONARY A: Acute Hypoxic Respiratory failure in setting of CAP, bilateral pleural effusions, and A-fib w/ RVR P:   Full vent support Wean FiO2 and PEEP as tolerated SBT when parameters met Follow intermittent CXR and ABG Abx as above Will give one time dose of Lasix 20 mg IV  CARDIOVASCULAR A:  A-fib w/ RVR ? CHF with bilateral pleural effusions P:  Cardiac monitoring Maintain MAP >65 Neosynephrine if needed to maintain MAP goal HR currently controlled, if needed consider Cardizem vs Amiodarone drip for rate control Echocardiogram pending Trend BNP, follow CXR Will give 20 mg Lasix x1 dose  RENAL A:   No acute issues P:   Monitor I&O's / urinary output Follow BMP Ensure adequate renal perfusion Avoid nephrotoxic agents as able Replace electrolytes as indicated  GASTROINTESTINAL A:   No acute issues P:   Keep NPO for now Pepcid for SUP  HEMATOLOGIC A:   Anemia without signs of active bleeding P:  Monitor for S/Sx of bleeding Trend CBC Lovenox for VTE Prophylaxis  Transfuse for Hgb <7   INFECTIOUS A:   CAP P:   Monitor fever curve Trend WBC's and Procalcitonin Follow cultures as above Start Cefepime and Vancomycin  ENDOCRINE A:   DM with Hyperglycemia  P:   CBG's SSI Follow ICU hypo/hyperglycemic protocol  NEUROLOGIC A:   Sedation needs in setting of mechanical ventilation P:    RASS goal: 0 to -1 Fentanyl and Propofol infusions as needed to maintain RASS goal Daily WUA Provide supportive care Avoid sedating meds as able   FAMILY  - Updates: No family present at bedside during NP rounds for update.  Pt is a full code.  - Inter-disciplinary family meet or Palliative Care meeting due by:  09/10/17    Darel Hong, AGACNP-BC [facility] Pager: [phone] Cell: [phone]  09/03/2018, 2:33 AM

## 2018-09-03 NOTE — Progress Notes (Signed)
[facility] [place] [facility]   PATIENT NAME: Dana SlipperMargaret Griffith    MR#:  [id number]  DATE OF BIRTH:  [birth year]  SUBJECTIVE:  CHIEF COMPLAINT:   Chief Complaint  Patient presents with  . Shortness of Breath   -Elderly female brought from home secondary to respiratory distress and is intubated for pneumonia and CHF exacerbation -Son is at bedside  REVIEW OF SYSTEMS:  Review of Systems  Unable to perform ROS: Critical illness    DRUG ALLERGIES:   Allergies  Allergen Reactions  . Niaspan [Niacin Er] Other (See Comments)    Severe flushing, redness of face as if sunburned    VITALS:  Blood pressure (!) 125/104, pulse 73, temperature 97.9 F (36.6 C), temperature source Oral, resp. rate 16, height 5\' 2"  (1.575 m), weight 55.2 kg, SpO2 95 %.  PHYSICAL EXAMINATION:  Physical Exam  GENERAL:  84 y.o.-year-old elderly patient lying in the bed with no acute distress.  EYES: Pupils equal, round, reactive to light and accommodation. No scleral icterus. Extraocular muscles intact.  HEENT: Head atraumatic, normocephalic. Oropharynx and nasopharynx clear.  NECK:  Supple, no jugular venous distention. No thyroid enlargement, no tenderness.  LUNGS: Normal breath sounds bilaterally, no wheezing, rales,rhonchi or crepitation. No use of accessory muscles of respiration.  Decreased bibasilar breath sounds CARDIOVASCULAR: S1, S2 normal. No  rubs, or gallops.  3/6 systolic murmur is present ABDOMEN: Soft, nontender, nondistended. Bowel sounds present. No organomegaly or mass.  EXTREMITIES: No pedal edema, cyanosis, or clubbing.  NEUROLOGIC: Currently sedated PSYCHIATRIC: The patient is intubated and sedated SKIN: No obvious rash, lesion, or ulcer.    LABORATORY PANEL:   CBC Recent Labs  Lab 09/03/18 0243  WBC 7.7  HGB 11.2*  HCT 36.2  PLT 197    ------------------------------------------------------------------------------------------------------------------  Chemistries  Recent Labs  Lab 08/26/2018 1826 09/03/18 0243  NA 137 136  K 4.3 4.9  CL 103 106  CO2 29 22  GLUCOSE 235* 364*  BUN 18 19  CREATININE 0.94 0.95  CALCIUM 9.0 8.3*  AST 21  --   ALT 12  --   ALKPHOS 116  --   BILITOT 0.5  --    ------------------------------------------------------------------------------------------------------------------  Cardiac Enzymes No results for input(s): TROPONINI in the last 168 hours. ------------------------------------------------------------------------------------------------------------------  RADIOLOGY:  Ct Chest Wo Contrast  Result Date: 08/23/2018 CLINICAL DATA:  Dyspnea cough since Thanksgiving. EXAM: CT CHEST WITHOUT CONTRAST TECHNIQUE: Multidetector CT imaging of the chest was performed following the standard protocol without IV contrast. COMPARISON:  CXR 08/16/2018 and 09/15/2010 FINDINGS: Cardiovascular: Cardiomegaly with aortic atherosclerosis is noted with left main and three-vessel coronary arteriosclerosis. No pericardial effusion or thickening. The thoracic aorta is nonaneurysmal. Unenhanced pulmonary vasculature is unremarkable. Mediastinum/Nodes: No thyromegaly or mass. Atherosclerosis of the great vessels. Small subcentimeter short axis mediastinal nodes are noted. Assessment for hilar adenopathy is limited due to lack of IV contrast. Small anterior chest wall hypodense subcutaneous nodule likely representing small sebaceous cyst is noted measuring up to 2.2 cm. Lungs/Pleura: Left greater than right moderate pleural effusions with adjacent compressive atelectasis. Respiratory motion artifacts limit assessment. Subtle nodular density in the left upper lobe measuring 4 mm, series 3/45 is identified. Posterior segment right upper lobe pulmonary consolidation is visualized, series 3/59 with air bronchograms.  Atelectasis or pneumonia might account for this appearance. No pneumothorax is seen. Upper Abdomen: No acute abnormality. Musculoskeletal: Thoracic spondylosis withage-indeterminate likely chronic mild inferior endplate compression of T8 and superior endplate of T10. IMPRESSION:  1. Moderate left greater than right pleural effusions with adjacent compressive atelectasis. 2. Posterior segment right upper lobe pulmonary consolidation with air bronchograms may represent atelectasis or pneumonia. 3. 4 mm nodular density in the left upper lobe. No follow-up needed if patient is low-risk. Non-contrast chest CT can be considered in 12 months if patient is high-risk. This recommendation follows the consensus statement: Guidelines for Management of Incidental Pulmonary Nodules Detected on CT Images: From the Fleischner Society 2017; Radiology 2017; 284:228-243. 4. Thoracic spondylosis. Mild inferior endplate compression of T8 and superior endplate compression T10 likely remote. No retropulsion. Aortic Atherosclerosis (ICD10-I70.0). Electronically Signed   By: Tollie Ethavid  Kwon M.D.   On: 08/16/2018 20:09   Portable Chest Xray  Result Date: 09/03/2018 CLINICAL DATA:  Acute respiratory failure EXAM: PORTABLE CHEST 1 VIEW COMPARISON:  09/02/2017 FINDINGS: Endotracheal tube terminates 2.5 cm above the carina. No frank interstitial edema. Suspected small bilateral pleural effusions, right greater than left. No pneumothorax. Cardiomegaly. Degenerative changes of the thoracic spine. IMPRESSION: Endotracheal tube terminates 2.5 cm above the carina. Suspected small bilateral pleural effusions, right greater than left. Electronically Signed   By: Charline BillsSriyesh  Krishnan M.D.   On: 09/03/2018 05:48   Dg Chest Portable 1 View  Result Date: 08/23/2018 CLINICAL DATA:  Respiratory failure. Status post intubation. EXAM: PORTABLE CHEST 1 VIEW COMPARISON:  Same day chest CT and CXR FINDINGS: New endotracheal tube tip is identified approximately  3.2 cm above the carina in satisfactory position. Right greater than left pleural effusions with atelectasis and interstitial edema is noted. Aortic atherosclerosis is identified. No acute osseous appearing abnormality. IMPRESSION: 1. Satisfactory endotracheal tube position. 2. Stable cardiomegaly and aortic atherosclerosis. 3. Right greater than left pleural effusions with atelectasis and interstitial edema. Electronically Signed   By: Tollie Ethavid  Kwon M.D.   On: 08/10/2018 21:58   Dg Chest Portable 1 View  Result Date: 09/07/2018 CLINICAL DATA:  Hypoxia, shortness of Breath EXAM: PORTABLE CHEST 1 VIEW COMPARISON:  07/17/2017 FINDINGS: Cardiomegaly with vascular congestion. Moderate bilateral pleural effusions with bibasilar atelectasis or infiltrates. No acute bony abnormality. IMPRESSION: Cardiomegaly with vascular congestion. Moderate effusions with bibasilar atelectasis or pneumonia. Electronically Signed   By: Charlett NoseKevin  Dover M.D.   On: 08/26/2018 18:14   Dg Abd Portable 1 View  Result Date: 08/09/2018 CLINICAL DATA:  Check gastric catheter placement EXAM: PORTABLE ABDOMEN - 1 VIEW COMPARISON:  Chest x-ray from earlier in the same day. FINDINGS: Gastric catheter is noted within the stomach. Bilateral pleural effusions are again noted similar to that seen on recent chest x-ray. Degenerative changes of the lumbar spine is noted. IMPRESSION: Gastric catheter within the stomach. Bilateral pleural effusions. Electronically Signed   By: Alcide CleverMark  Lukens M.D.   On: 08/26/2018 23:46    EKG:   Orders placed or performed during the hospital encounter of 09/07/2018  . ED EKG  . ED EKG  . EKG 12-Lead  . EKG 12-Lead    ASSESSMENT AND PLAN:   84 year old female with past medical history significant for A. fib not on anticoagulation, mild dementia, history of CHF unknown ejection fraction, diabetes and TIA who lives at home with her daughter was brought in secondary to difficulty breathing  1.  Acute hypoxic  respiratory failure-intubated and currently on ventilator -Management per intensivist -Respiratory failure secondary to pneumonia and CHF exacerbation -Currently on vancomycin and cefepime -Received a dose of Lasix in the emergency room -Not on vasopressors, WBC is within normal limits  2.  CHF exacerbation-echo is pending,  unknown ejection fraction -Lasix as needed -Currently on the ventilator  3.  Diabetes mellitus-on sliding scale insulin.  Recommend adding Lantus as sugars are elevated.  4.  Mild dementia-according to son, still able to recognize family has mild dementia.  Ambulates with a walker at baseline.  5.  DVT prophylaxis-Lovenox.  Palliative care consulted for goals of care conversation.  Continue current management at this time.     All the records are reviewed and case discussed with Care Management/Social Workerr. Management plans discussed with the patient, family and they are in agreement.  CODE STATUS: Full Code  TOTAL TIME TAKING CARE OF THIS PATIENT: 28 minutes.   POSSIBLE D/C IN ? DAYS, DEPENDING ON CLINICAL CONDITION.   Enid Baas M.D on 09/03/2018 at 3:34 PM  Between 7am to 6pm - Pager - [phone]  After 6pm go to [email or web address] - password [employer]  [facility]  Office  [phone]  CC: Primary care physician; Patient, No Pcp Per

## 2018-09-03 NOTE — Progress Notes (Signed)
   09/03/18 1545  Clinical Encounter Type  Visited With Patient and family together  Visit Type Follow-up  Recommendations check back in the evening  Spiritual Encounters  Spiritual Needs Emotional;Prayer;Grief support

## 2018-09-04 ENCOUNTER — Inpatient Hospital Stay (HOSPITAL_COMMUNITY)
Admit: 2018-09-04 | Discharge: 2018-09-04 | Disposition: A | Discharge status: Home / Self Care | Payer: Medicare Other | Attending: Pulmonary Disease | Admitting: Pulmonary Disease

## 2018-09-04 ENCOUNTER — Inpatient Hospital Stay: Payer: Medicare Other

## 2018-09-04 ENCOUNTER — Encounter: Payer: Self-pay | Admitting: *Deleted

## 2018-09-04 DIAGNOSIS — I5043 Acute on chronic combined systolic (congestive) and diastolic (congestive) heart failure: Secondary | ICD-10-CM

## 2018-09-04 DIAGNOSIS — I340 Nonrheumatic mitral (valve) insufficiency: Secondary | ICD-10-CM

## 2018-09-04 LAB — CBC
HCT: 44.5 % (ref 36.0–46.0)
Hemoglobin: 13.8 g/dL (ref 12.0–15.0)
MCH: 29 pg (ref 26.0–34.0)
MCHC: 31 g/dL (ref 30.0–36.0)
MCV: 93.5 fL (ref 80.0–100.0)
Platelets: 261 10*3/uL (ref 150–400)
RBC: 4.76 MIL/uL (ref 3.87–5.11)
RDW: 14 % (ref 11.5–15.5)
WBC: 21.2 10*3/uL — ABNORMAL HIGH (ref 4.0–10.5)
nRBC: 0 % (ref 0.0–0.2)

## 2018-09-04 LAB — GLUCOSE, CAPILLARY
GLUCOSE-CAPILLARY: 80 mg/dL (ref 70–99)
GLUCOSE-CAPILLARY: 98 mg/dL (ref 70–99)
Glucose-Capillary: 103 mg/dL — ABNORMAL HIGH (ref 70–99)
Glucose-Capillary: 87 mg/dL (ref 70–99)
Glucose-Capillary: 88 mg/dL (ref 70–99)
Glucose-Capillary: 98 mg/dL (ref 70–99)

## 2018-09-04 LAB — BASIC METABOLIC PANEL
Anion gap: 7 (ref 5–15)
BUN: 24 mg/dL — ABNORMAL HIGH (ref 8–23)
CO2: 24 mmol/L (ref 22–32)
CREATININE: 1.15 mg/dL — AB (ref 0.44–1.00)
Calcium: 8.7 mg/dL — ABNORMAL LOW (ref 8.9–10.3)
Chloride: 105 mmol/L (ref 98–111)
GFR calc Af Amer: 47 mL/min — ABNORMAL LOW (ref >60)
GFR calc non Af Amer: 41 mL/min — ABNORMAL LOW (ref >60)
Glucose, Bld: 122 mg/dL — ABNORMAL HIGH (ref 70–99)
Potassium: 4.2 mmol/L (ref 3.5–5.1)
Sodium: 136 mmol/L (ref 135–145)

## 2018-09-04 LAB — PROCALCITONIN: Procalcitonin: <0.1 ng/mL

## 2018-09-04 LAB — ECHOCARDIOGRAM COMPLETE
Height: 62 in
Weight: 1947.1 oz

## 2018-09-04 MED ORDER — SODIUM CHLORIDE 0.9 % IV SOLN
INTRAVENOUS | Status: DC | PRN
  Administered 2018-09-04 – 2018-09-05 (×2): 250 mL via INTRAVENOUS

## 2018-09-04 NOTE — Progress Notes (Signed)
Follow up - Critical Care Medicine Note  Patient Details:    Dana MansonMargaret H Stifter is an 84 y.o. female. 84 y.o. Female admitted with Acute Hypoxic Respiratory failure requiring intubation in the ED in the setting of bilateral pleural effusions, and  A-fib with RVR. Lines, Airways, Drains: Airway 7.5 mm (Active)  Secured at (cm) 22 cm 09/04/2018  4:10 PM  Measured From Lips 09/04/2018  4:10 PM  Secured Location Right 09/04/2018  4:10 PM  Secured By [employer] Tube Holder 09/04/2018  4:10 PM  Tube Holder Repositioned Yes 09/04/2018  8:00 AM  Cuff Pressure (cm H2O) 20 cm H2O 09/04/2018 11:34 AM  Site Condition Dry 09/04/2018  7:45 AM     NG/OG Tube Orogastric 18 Fr. Center mouth Xray;Aucultation Documented cm marking at nare/ corner of mouth 51 cm (Active)  Cm Marking at Nare/Corner of Mouth (if applicable) 51 cm 09/04/2018  7:45 AM  Site Assessment Clean;Dry;Intact 09/04/2018  7:45 AM  Ongoing Placement Verification No change in cm markings or external length of tube from initial placement 09/04/2018  7:45 AM  Status Suction-low intermittent 09/04/2018  7:45 AM  Amount of suction 80 mmHg 09/04/2018  7:45 AM  Drainage Appearance Green;Yellow 09/04/2018  7:45 AM  Intake (mL) 150 mL 09/04/2018 10:55 AM  Output (mL) 5 mL 09/03/2018  6:00 PM     Urethral Catheter Erie NoeVanessa, RN Double-lumen 14 Fr. (Active)  Indication for Insertion or Continuance of Catheter Unstable critical patients (first 24-48 hours) 09/04/2018  7:45 AM  Site Assessment Clean;Intact 09/04/2018  7:45 AM  Catheter Maintenance Bag below level of bladder;Drainage bag/tubing not touching floor;Catheter secured;Insertion date on drainage bag;No dependent loops 09/04/2018  7:45 AM  Collection Container Standard drainage bag 09/04/2018  7:45 AM  Securement Method Securing device (Describe) 09/04/2018  7:45 AM  Urinary Catheter Interventions Unclamped 09/04/2018  7:45 AM  Input (mL) 150 mL 09/03/2018  4:25 AM  Output (mL) 100 mL  09/04/2018  4:49 PM    Anti-infectives:  Anti-infectives (From admission, onward)   Start     Dose/Rate Route Frequency Ordered Stop   09/04/18 0200  vancomycin (VANCOCIN) IVPB 1000 mg/200 mL premix  Status:  Discontinued     1,000 mg 200 mL/hr over 60 Minutes Intravenous Every 36 hours 09/03/18 0436 09/03/18 1022   09/03/18 1800  cefTRIAXone (ROCEPHIN) 1 g in sodium chloride 0.9 % 100 mL IVPB     1 g 200 mL/hr over 30 Minutes Intravenous Every 24 hours 09/03/18 1022     09/03/18 0245  ceFEPIme (MAXIPIME) 2 g in sodium chloride 0.9 % 100 mL IVPB  Status:  Discontinued     2 g 200 mL/hr over 30 Minutes Intravenous Every 24 hours 09/03/18 0241 09/03/18 1022   09/03/18 0245  vancomycin (VANCOCIN) IVPB 1000 mg/200 mL premix     1,000 mg 200 mL/hr over 60 Minutes Intravenous  Once 09/03/18 0241 09/03/18 0530   08/28/2018 1900  cefTRIAXone (ROCEPHIN) 1 g in sodium chloride 0.9 % 100 mL IVPB     1 g 200 mL/hr over 30 Minutes Intravenous  Once 08/09/2018 1855 08/21/2018 2013   08/27/2018 1900  azithromycin (ZITHROMAX) 500 mg in sodium chloride 0.9 % 250 mL IVPB     500 mg 250 mL/hr over 60 Minutes Intravenous  Once 08/28/2018 1855 09/01/2018 2134      Microbiology: Results for orders placed or performed during the hospital encounter of 09/04/2018  MRSA PCR Screening     Status: None   Collection  Time: 09/03/18  1:59 AM  Result Value Ref Range Status   MRSA by PCR NEGATIVE NEGATIVE Final    Comment:        The GeneXpert MRSA Assay (FDA approved for NASAL specimens only), is one component of a comprehensive MRSA colonization surveillance program. It is not intended to diagnose MRSA infection nor to guide or monitor treatment for MRSA infections. Performed at [facility], [address] [place], [place] [address]   Culture, respiratory (non-expectorated)     Status: None (Preliminary result)   Collection Time: 09/03/18  8:58 AM  Result Value Ref Range Status   Specimen Description    Final    TRACHEAL ASPIRATE Performed at [facility], [address] [place], [place] [address]    Special Requests   Final    Normal Performed at [facility], [address] [place], [place] [address]    Gram Stain   Final    MODERATE WBC PRESENT, PREDOMINANTLY PMN RARE SQUAMOUS EPITHELIAL CELLS PRESENT RARE GRAM POSITIVE COCCI    Culture   Final    FEW Consistent with normal respiratory flora. Performed at [facility], [address] [place], [place] [address]    Report Status PENDING  Incomplete    Best Practice/Protocols:  VTE Prophylaxis: Lovenox (prophylaxtic dose) GI Prophylaxis: Antihistamine Intermittent Sedation/ventilator management. ICU hypo/hyperglycemic protocol.  Events: 2D echo performed today.  Studies: Ct Chest Wo Contrast  Result Date: 08/31/2018 CLINICAL DATA:  Dyspnea cough since Thanksgiving. EXAM: CT CHEST WITHOUT CONTRAST TECHNIQUE: Multidetector CT imaging of the chest was performed following the standard protocol without IV contrast. COMPARISON:  CXR 08/23/2018 and 09/15/2010 FINDINGS: Cardiovascular: Cardiomegaly with aortic atherosclerosis is noted with left main and three-vessel coronary arteriosclerosis. No pericardial effusion or thickening. The thoracic aorta is nonaneurysmal. Unenhanced pulmonary vasculature is unremarkable. Mediastinum/Nodes: No thyromegaly or mass. Atherosclerosis of the great vessels. Small subcentimeter short axis mediastinal nodes are noted. Assessment for hilar adenopathy is limited due to lack of IV contrast. Small anterior chest wall hypodense subcutaneous nodule likely representing small sebaceous cyst is noted measuring up to 2.2 cm. Lungs/Pleura: Left greater than right moderate pleural effusions with adjacent compressive atelectasis. Respiratory motion artifacts limit assessment. Subtle nodular density in the left upper lobe measuring 4 mm, series 3/45 is identified. Posterior segment right  upper lobe pulmonary consolidation is visualized, series 3/59 with air bronchograms. Atelectasis or pneumonia might account for this appearance. No pneumothorax is seen. Upper Abdomen: No acute abnormality. Musculoskeletal: Thoracic spondylosis withage-indeterminate likely chronic mild inferior endplate compression of T8 and superior endplate of T10. IMPRESSION: 1. Moderate left greater than right pleural effusions with adjacent compressive atelectasis. 2. Posterior segment right upper lobe pulmonary consolidation with air bronchograms may represent atelectasis or pneumonia. 3. 4 mm nodular density in the left upper lobe. No follow-up needed if patient is low-risk. Non-contrast chest CT can be considered in 12 months if patient is high-risk. This recommendation follows the consensus statement: Guidelines for Management of Incidental Pulmonary Nodules Detected on CT Images: From the Fleischner Society 2017; Radiology 2017; 284:228-243. 4. Thoracic spondylosis. Mild inferior endplate compression of T8 and superior endplate compression T10 likely remote. No retropulsion. Aortic Atherosclerosis (ICD10-I70.0). Electronically Signed   By: Tollie Ethavid  Kwon M.D.   On: 09/05/2018 20:09   Dg Chest Port 1 View  Result Date: 09/04/2018 CLINICAL DATA:  Acute respiratory failure EXAM: PORTABLE CHEST 1 VIEW COMPARISON:  Chest radiograph 09/03/2018 FINDINGS: ET tube terminates in the mid  trachea. Enteric tube courses inferior to the diaphragm. Monitoring leads overlie the patient. Stable cardiac and mediastinal contours. Persistent layering bilateral pleural effusions with underlying opacities. No pneumothorax. IMPRESSION: Sport apparatus as above. Layering effusions with underlying opacities. Electronically Signed   By: Annia Belt M.D.   On: 09/04/2018 08:00   Portable Chest Xray  Result Date: 09/03/2018 CLINICAL DATA:  Acute respiratory failure EXAM: PORTABLE CHEST 1 VIEW COMPARISON:  09/02/2017 FINDINGS: Endotracheal tube  terminates 2.5 cm above the carina. No frank interstitial edema. Suspected small bilateral pleural effusions, right greater than left. No pneumothorax. Cardiomegaly. Degenerative changes of the thoracic spine. IMPRESSION: Endotracheal tube terminates 2.5 cm above the carina. Suspected small bilateral pleural effusions, right greater than left. Electronically Signed   By: Charline Bills M.D.   On: 09/03/2018 05:48   Dg Chest Portable 1 View  Result Date: 09/13/2018 CLINICAL DATA:  Respiratory failure. Status post intubation. EXAM: PORTABLE CHEST 1 VIEW COMPARISON:  Same day chest CT and CXR FINDINGS: New endotracheal tube tip is identified approximately 3.2 cm above the carina in satisfactory position. Right greater than left pleural effusions with atelectasis and interstitial edema is noted. Aortic atherosclerosis is identified. No acute osseous appearing abnormality. IMPRESSION: 1. Satisfactory endotracheal tube position. 2. Stable cardiomegaly and aortic atherosclerosis. 3. Right greater than left pleural effusions with atelectasis and interstitial edema. Electronically Signed   By: Tollie Eth M.D.   On: Sep 13, 2018 21:58   Dg Chest Portable 1 View  Result Date: 09/13/2018 CLINICAL DATA:  Hypoxia, shortness of Breath EXAM: PORTABLE CHEST 1 VIEW COMPARISON:  07/17/2017 FINDINGS: Cardiomegaly with vascular congestion. Moderate bilateral pleural effusions with bibasilar atelectasis or infiltrates. No acute bony abnormality. IMPRESSION: Cardiomegaly with vascular congestion. Moderate effusions with bibasilar atelectasis or pneumonia. Electronically Signed   By: Charlett Nose M.D.   On: 2018-09-13 18:14   Dg Abd Portable 1 View  Result Date: 2018-09-13 CLINICAL DATA:  Check gastric catheter placement EXAM: PORTABLE ABDOMEN - 1 VIEW COMPARISON:  Chest x-ray from earlier in the same day. FINDINGS: Gastric catheter is noted within the stomach. Bilateral pleural effusions are again noted similar to that  seen on recent chest x-ray. Degenerative changes of the lumbar spine is noted. IMPRESSION: Gastric catheter within the stomach. Bilateral pleural effusions. Electronically Signed   By: Alcide Clever M.D.   On: 2018/09/13 23:46   Echocardiogram results available today: Her ejection fraction has dropped to 30 to 35% from a prior ejection fraction of 60 to 65% noted in 2018.  An echo of 2018 also showed that she had grade 3 diastolic dysfunction now she has global hypokinesis and systolic component.  She has moderate pulmonary hypertension, previously this was mild.  A prior cardiac catheterization performed in 2007 indicated that she had significant coronary artery disease but was being treated medically.  Consults:    Subjective:    Overnight Issues:  Only issues overnight is that whenever her sedation is 1 she will become agitated.  No other issues noted. Objective:  Vital signs for last 24 hours: Temp:  [96.7 F (35.9 C)-98.5 F (36.9 C)] 98.5 F (36.9 C) (12/28 1630) Pulse Rate:  [25-108] 85 (12/28 1000) Resp:  [8-24] 16 (12/28 1630) BP: (72-131)/(52-117) 99/67 (12/28 1630) SpO2:  [97 %-100 %] 100 % (12/28 1630) FiO2 (%):  [35 %] 35 % (12/28 1610)  Hemodynamic parameters for last 24 hours:  Reviewed  Intake/Output from previous day: 12/27 0701 - 12/28 0700 In: 563.6 [I.V.:381.9; IV Piggyback:181.8] Out:  1885 [Urine:1880; Emesis/NG output:5]  Intake/Output this shift: Total I/O In: 460.2 [I.V.:310.2; NG/GT:150] Out: 400 [Urine:400]  Vent settings for last 24 hours: Vent Mode: PRVC FiO2 (%):  [35 %] 35 % Set Rate:  [16 bmp] 16 bmp Vt Set:  [400 mL] 400 mL PEEP:  [5 cmH20] 5 cmH20 Plateau Pressure:  [17 cmH20-19 cmH20] 17 cmH20  Physical Exam:  General:   Frail, chronically ill appearing female, laying in bed, intubated, sedated, thin. Neuro:  Sedated, withdraws from pain, Pupils PERRL 3 mm sluggish bilaterally.  When sedation lightened she will get agitated. HEENT:   Atraumatic, normocephalic, neck supple, JVD, ETT in place Cardiovascular:  Irregulary irregular rhythm, rate controlled, grade 2/6 systolic ejection murmur at the left sternal border. Lungs:   Rales at the bases with diminished breath sounds at the bases  Abdomen:  Soft, nontender, nondistended, no rebound tenderness or guarding, BS+ x4 Musculoskeletal:  No deformities, normal bulk and tone, 1+ edema bilateral LE Skin:  Warm/dry.  No obvious rashes, lesions, or ulcerations  Assessment/Plan:   1.  Acute hypoxic respiratory failure, ventilator dependent caused by decompensation of systolic heart failure and A. fib with RVR: Continue ventilator support, have attempted to wean patient however becomes very agitated when sedation is lightened.  Reassess in the morning. I have low index of suspicion for pneumonia and therefore if cultures are negative recommend discontinuing antibiotics I suspect that her decompensation has been cardiac.  2.  Cardiomyopathy likely ischemic: She has had decompensation, we were able to obtain copy of echocardiogram performed in 2018 in [place].  The patient at that time had findings consistent with significant diastolic dysfunction (grade III), mild pulmonary hypertension and ejection fraction of 60 to 65%.  Currently her ejection fraction is 30 to 35%.  She also has significant valvular disease both aortic stenosis and mitral regurgitation.  These results were available later in the day I will reconvene with the family tomorrow with regards to poor prognosis in this situation particularly with her advanced age and frailty.  I did discuss with the patient's family that I suspect that her issues were cardiac even prior to obtaining 2D echo.  3.  Atrial fibrillation with RVR: She has been better controlled with as needed metoprolol.  4.  Bilateral pleural effusions: These are due to decompensated heart failure, she has been difficult to diurese due to labile blood pressures.   She may need a short course of milrinone to see if she can be diuresed effectively.  5.  Diabetes mellitus with hyperglycemia: Continue to monitor CBGs, she is on sliding scale insulin, continue ICU hypo-/hyperglycemic protocol.  6.  No evidence of acute infectious process.  Consider discontinuing antibiotics.  7.  Agitation when sedation discontinued: Patient is at high risk for ICU delirium continue to limit use of benzodiazepines and narcotics. Hopefully able to extubate soon.  8.  End-of-life issues: Have initiated conversation with the family as to how to proceed once extubated, with her advanced age, frailty and current cardiac situation I do not recommend full code status.  The patient would be best served by palliation of symptoms.  We will reconvene with the family in the morning.   LOS: 2 days   Additional comments: I have personally reviewed all of the new lab data.  Reviewed results of the 2D echo.  Discussed and updated patient's situation with the family.  Critical Care Total Time*: 45 Minutes  Sarina Ser 09/04/2018  *Care during the described time interval was provided  by me and/or other providers on the critical care team.  I have reviewed this patient's available data, including medical history, events of note, physical examination and test results as part of my evaluation.

## 2018-09-04 NOTE — Progress Notes (Signed)
Patient was ordered Lasix yesterday at 1800  but was held due to hypotension. Patient has Neo infusing and BP has improved. Notified Maggie, NP of decreased urine output this shift and received a verbal order to give the dose of Lasix ordered yesterday at 1800. Continue to monitor patient closely.

## 2018-09-04 NOTE — Progress Notes (Signed)
Patient is hypotensive, agitated and requiring increased sedation. Discussed with Seward GraterMaggie, NP. Acknowledged. See new orders.

## 2018-09-04 NOTE — Progress Notes (Signed)
[employer] - [place] at [facility]   PATIENT NAME: Dana Griffith    MR#:  [id number]  DATE OF BIRTH:  02/17/1922  SUBJECTIVE:  CHIEF COMPLAINT:   Chief Complaint  Patient presents with  . Shortness of Breath   -Remains on the ventilator.  Agitated last night and so sedation is increased. -Daughter is at bedside.  Also had to be started on pressors for hypotension  REVIEW OF SYSTEMS:  Review of Systems  Unable to perform ROS: Critical illness    DRUG ALLERGIES:   Allergies  Allergen Reactions  . Niaspan [Niacin Er] Other (See Comments)    Severe flushing, redness of face as if sunburned    VITALS:  Blood pressure 118/80, pulse 85, temperature 98.1 F (36.7 C), temperature source Oral, resp. rate 16, height 5\' 2"  (1.575 m), weight 55.2 kg, SpO2 100 %.  PHYSICAL EXAMINATION:  Physical Exam  GENERAL:  84 y.o.-year-old elderly patient lying in the bed with no acute distress.  EYES: Pupils equal, round, reactive to light and accommodation. No scleral icterus. Extraocular muscles intact.  HEENT: Head atraumatic, normocephalic. Oropharynx and nasopharynx clear.  NECK:  Supple, no jugular venous distention. No thyroid enlargement, no tenderness.  LUNGS: Normal breath sounds bilaterally, no wheezing, rales,rhonchi or crepitation. No use of accessory muscles of respiration.  Decreased bibasilar breath sounds CARDIOVASCULAR: S1, S2 normal. No  rubs, or gallops.  3/6 systolic murmur is present ABDOMEN: Soft, nontender, nondistended. Bowel sounds present. No organomegaly or mass.  EXTREMITIES: No pedal edema, cyanosis, or clubbing.  NEUROLOGIC: Currently sedated PSYCHIATRIC: The patient is intubated and sedated SKIN: No obvious rash, lesion, or ulcer.    LABORATORY PANEL:   CBC Recent Labs  Lab 09/04/18 0550  WBC 21.2*  HGB 13.8  HCT 44.5  PLT 261    ------------------------------------------------------------------------------------------------------------------  Chemistries  Recent Labs  Lab 09/03/18 2137 09/04/18 0550  NA 137 136  K 4.2 4.2  CL 106 105  CO2 24 24  GLUCOSE 161* 122*  BUN 23 24*  CREATININE 0.91 1.15*  CALCIUM 8.6* 8.7*  MG 1.9  --   AST 19  --   ALT 12  --   ALKPHOS 101  --   BILITOT 0.4  --    ------------------------------------------------------------------------------------------------------------------  Cardiac Enzymes No results for input(s): TROPONINI in the last 168 hours. ------------------------------------------------------------------------------------------------------------------  RADIOLOGY:  Ct Chest Wo Contrast  Result Date: 09-24-2018 CLINICAL DATA:  Dyspnea cough since Thanksgiving. EXAM: CT CHEST WITHOUT CONTRAST TECHNIQUE: Multidetector CT imaging of the chest was performed following the standard protocol without IV contrast. COMPARISON:  CXR 09-24-18 and 09/15/2010 FINDINGS: Cardiovascular: Cardiomegaly with aortic atherosclerosis is noted with left main and three-vessel coronary arteriosclerosis. No pericardial effusion or thickening. The thoracic aorta is nonaneurysmal. Unenhanced pulmonary vasculature is unremarkable. Mediastinum/Nodes: No thyromegaly or mass. Atherosclerosis of the great vessels. Small subcentimeter short axis mediastinal nodes are noted. Assessment for hilar adenopathy is limited due to lack of IV contrast. Small anterior chest wall hypodense subcutaneous nodule likely representing small sebaceous cyst is noted measuring up to 2.2 cm. Lungs/Pleura: Left greater than right moderate pleural effusions with adjacent compressive atelectasis. Respiratory motion artifacts limit assessment. Subtle nodular density in the left upper lobe measuring 4 mm, series 3/45 is identified. Posterior segment right upper lobe pulmonary consolidation is visualized, series 3/59 with air  bronchograms. Atelectasis or pneumonia might account for this appearance. No pneumothorax is seen. Upper Abdomen: No acute abnormality. Musculoskeletal: Thoracic spondylosis withage-indeterminate likely chronic mild  inferior endplate compression of T8 and superior endplate of T10. IMPRESSION: 1. Moderate left greater than right pleural effusions with adjacent compressive atelectasis. 2. Posterior segment right upper lobe pulmonary consolidation with air bronchograms may represent atelectasis or pneumonia. 3. 4 mm nodular density in the left upper lobe. No follow-up needed if patient is low-risk. Non-contrast chest CT can be considered in 12 months if patient is high-risk. This recommendation follows the consensus statement: Guidelines for Management of Incidental Pulmonary Nodules Detected on CT Images: From the Fleischner Society 2017; Radiology 2017; 284:228-243. 4. Thoracic spondylosis. Mild inferior endplate compression of T8 and superior endplate compression T10 likely remote. No retropulsion. Aortic Atherosclerosis (ICD10-I70.0). Electronically Signed   By: Tollie Ethavid  Kwon M.D.   On: 08-14-18 20:09   Dg Chest Port 1 View  Result Date: 09/04/2018 CLINICAL DATA:  Acute respiratory failure EXAM: PORTABLE CHEST 1 VIEW COMPARISON:  Chest radiograph 09/03/2018 FINDINGS: ET tube terminates in the mid trachea. Enteric tube courses inferior to the diaphragm. Monitoring leads overlie the patient. Stable cardiac and mediastinal contours. Persistent layering bilateral pleural effusions with underlying opacities. No pneumothorax. IMPRESSION: Sport apparatus as above. Layering effusions with underlying opacities. Electronically Signed   By: Annia Beltrew  Davis M.D.   On: 09/04/2018 08:00   Portable Chest Xray  Result Date: 09/03/2018 CLINICAL DATA:  Acute respiratory failure EXAM: PORTABLE CHEST 1 VIEW COMPARISON:  09/02/2017 FINDINGS: Endotracheal tube terminates 2.5 cm above the carina. No frank interstitial edema.  Suspected small bilateral pleural effusions, right greater than left. No pneumothorax. Cardiomegaly. Degenerative changes of the thoracic spine. IMPRESSION: Endotracheal tube terminates 2.5 cm above the carina. Suspected small bilateral pleural effusions, right greater than left. Electronically Signed   By: Charline BillsSriyesh  Krishnan M.D.   On: 09/03/2018 05:48   Dg Chest Portable 1 View  Result Date: 08-14-18 CLINICAL DATA:  Respiratory failure. Status post intubation. EXAM: PORTABLE CHEST 1 VIEW COMPARISON:  Same day chest CT and CXR FINDINGS: New endotracheal tube tip is identified approximately 3.2 cm above the carina in satisfactory position. Right greater than left pleural effusions with atelectasis and interstitial edema is noted. Aortic atherosclerosis is identified. No acute osseous appearing abnormality. IMPRESSION: 1. Satisfactory endotracheal tube position. 2. Stable cardiomegaly and aortic atherosclerosis. 3. Right greater than left pleural effusions with atelectasis and interstitial edema. Electronically Signed   By: Tollie Ethavid  Kwon M.D.   On: 08-14-18 21:58   Dg Chest Portable 1 View  Result Date: 08-14-18 CLINICAL DATA:  Hypoxia, shortness of Breath EXAM: PORTABLE CHEST 1 VIEW COMPARISON:  07/17/2017 FINDINGS: Cardiomegaly with vascular congestion. Moderate bilateral pleural effusions with bibasilar atelectasis or infiltrates. No acute bony abnormality. IMPRESSION: Cardiomegaly with vascular congestion. Moderate effusions with bibasilar atelectasis or pneumonia. Electronically Signed   By: Charlett NoseKevin  Dover M.D.   On: 08-14-18 18:14   Dg Abd Portable 1 View  Result Date: 08-14-18 CLINICAL DATA:  Check gastric catheter placement EXAM: PORTABLE ABDOMEN - 1 VIEW COMPARISON:  Chest x-ray from earlier in the same day. FINDINGS: Gastric catheter is noted within the stomach. Bilateral pleural effusions are again noted similar to that seen on recent chest x-ray. Degenerative changes of the lumbar  spine is noted. IMPRESSION: Gastric catheter within the stomach. Bilateral pleural effusions. Electronically Signed   By: Alcide CleverMark  Lukens M.D.   On: 08-14-18 23:46    EKG:   Orders placed or performed during the hospital encounter of 04-Jan-2018  . ED EKG  . ED EKG  . EKG 12-Lead  . EKG 12-Lead  ASSESSMENT AND PLAN:   84 year old female with past medical history significant for A. fib not on anticoagulation, mild dementia, history of CHF unknown ejection fraction, diabetes and TIA who lives at home with her daughter was brought in secondary to difficulty breathing  1.  Acute hypoxic respiratory failure-intubated and currently on ventilator -Management per intensivist -Respiratory failure secondary to pneumonia and CHF exacerbation -Currently on Rocephin -Also on Lasix as needed since blood pressure is low. -Echocardiogram is pending -Needing pressors now.  Concern for cardiogenic shock, could be cardiomyopathy -continue to  wean sedation and off the vent as tolerated  2.  CHF exacerbation-echo is pending, unknown ejection fraction -Lasix as needed -Currently on the ventilator -On low-dose pressors.  Concern for cardiogenic shock  3.  Diabetes mellitus-on sliding scale insulin.    4.  Mild dementia-according to son, still able to recognize family has mild dementia.  Ambulates with minimal assistance at baseline.  5.  DVT prophylaxis-Lovenox.  Palliative care consulted for goals of care conversation.  Continue current management at this time.     All the records are reviewed and case discussed with Care Management/Social Workerr. Management plans discussed with the patient, family and they are in agreement.  CODE STATUS: Full Code  TOTAL TIME TAKING CARE OF THIS PATIENT: 28 minutes.   POSSIBLE D/C IN ? DAYS, DEPENDING ON CLINICAL CONDITION.   Enid Baasadhika Dayonna Selbe M.D on 09/04/2018 at 10:34 AM  Between 7am to 6pm - Pager - [phone]  After 6pm go to [email or web address] -  password [email or web address]  [facility]  Office  [phone]  CC: Primary care physician; Patient, No Pcp Per

## 2018-09-05 LAB — BASIC METABOLIC PANEL
Anion gap: 10 (ref 5–15)
BUN: 24 mg/dL — ABNORMAL HIGH (ref 8–23)
CO2: 24 mmol/L (ref 22–32)
Calcium: 8.3 mg/dL — ABNORMAL LOW (ref 8.9–10.3)
Chloride: 105 mmol/L (ref 98–111)
Creatinine, Ser: 1.03 mg/dL — ABNORMAL HIGH (ref 0.44–1.00)
GFR calc Af Amer: 54 mL/min — ABNORMAL LOW (ref >60)
GFR calc non Af Amer: 46 mL/min — ABNORMAL LOW (ref >60)
Glucose, Bld: 103 mg/dL — ABNORMAL HIGH (ref 70–99)
POTASSIUM: 3.9 mmol/L (ref 3.5–5.1)
Sodium: 139 mmol/L (ref 135–145)

## 2018-09-05 LAB — GLUCOSE, CAPILLARY
GLUCOSE-CAPILLARY: 103 mg/dL — AB (ref 70–99)
Glucose-Capillary: 94 mg/dL (ref 70–99)
Glucose-Capillary: 111 mg/dL — ABNORMAL HIGH (ref 70–99)
Glucose-Capillary: 86 mg/dL (ref 70–99)
Glucose-Capillary: 72 mg/dL (ref 70–99)

## 2018-09-05 LAB — TROPONIN I: Troponin I: 0.06 ng/mL (ref <0.03)

## 2018-09-05 LAB — CULTURE, RESPIRATORY W GRAM STAIN
Culture: NORMAL
Special Requests: NORMAL

## 2018-09-05 LAB — PHOSPHORUS: Phosphorus: 3.6 mg/dL (ref 2.5–4.6)

## 2018-09-05 LAB — MAGNESIUM: Magnesium: 1.8 mg/dL (ref 1.7–2.4)

## 2018-09-05 MED ORDER — SODIUM CHLORIDE 0.9 % IV BOLUS
250.0000 mL | Freq: Once | INTRAVENOUS | Status: AC
  Administered 2018-09-05: 250 mL via INTRAVENOUS

## 2018-09-05 MED ORDER — FENTANYL BOLUS VIA INFUSION
50.0000 ug | Freq: Once | INTRAVENOUS | Status: AC
  Administered 2018-09-05: 50 ug via INTRAVENOUS

## 2018-09-05 MED ORDER — SODIUM CHLORIDE 0.9 % IV SOLN
0.0000 ug/min | INTRAVENOUS | Status: DC
  Administered 2018-09-05: 15 ug/min via INTRAVENOUS
  Administered 2018-09-06: 30 ug/min via INTRAVENOUS
  Administered 2018-09-06: 25 ug/min via INTRAVENOUS
  Administered 2018-09-07: 4 ug/min via INTRAVENOUS
  Filled 2018-09-05 (×2): qty 10
  Filled 2018-09-05: qty 1
  Filled 2018-09-05: qty 10

## 2018-09-05 NOTE — Progress Notes (Signed)
Passed troponin result of .06 to day RN, she will discuss with the oncoming MD.

## 2018-09-05 NOTE — Progress Notes (Signed)
Chaplain was rounding and saw family of Pt emoting. Chaplain asked if they needed womething and offered prayer. Family accepted and Chaplain prayed for the family   09/05/18 1900  Clinical Encounter Type  Visited With Patient and family together  Visit Type Spiritual support  Referral From Chaplain  Spiritual Encounters  Spiritual Needs Prayer;Emotional

## 2018-09-05 NOTE — Progress Notes (Signed)
Family is requesting morphine that was referenced in previous conversation with day shift MD. Notified e-Link and spoke with Dr. Violet BaldyAventura concerning family request. Patient is currently on Fentanyl and Propofol which can be titrated if needed. No new orders obtained at this time.

## 2018-09-05 NOTE — Progress Notes (Signed)
Verbal order received for Fentanyl IV bolus once now.

## 2018-09-05 NOTE — Progress Notes (Signed)
Patients HR up to 150's, Seward GraterMaggie, NP at bedside. Orders received and entered. Continue to monitor patient closely.

## 2018-09-05 NOTE — Progress Notes (Signed)
[employer] - [place] at [facility]   PATIENT NAME: Dana Griffith    MR#:  [id number]  DATE OF BIRTH:  05-26-1922  SUBJECTIVE:  CHIEF COMPLAINT:   Chief Complaint  Patient presents with  . Shortness of Breath   -Failed spontaneous breathing trial yesterday.  Remains on 2 different sedatives and also on phenylephrine and hypertensive -No change noted.  Overall poor prognosis.  Granddaughter updated at bedside  REVIEW OF SYSTEMS:  Review of Systems  Unable to perform ROS: Critical illness    DRUG ALLERGIES:   Allergies  Allergen Reactions  . Niaspan [Niacin Er] Other (See Comments)    Severe flushing, redness of face as if sunburned    VITALS:  Blood pressure 129/71, pulse 74, temperature 99.6 F (37.6 C), temperature source Axillary, resp. rate 16, height 5\' 2"  (1.575 m), weight 55.2 kg, SpO2 100 %.  PHYSICAL EXAMINATION:  Physical Exam  GENERAL:  84 y.o.-year-old elderly patient lying in the bed, critically ill-appearing  EYES: Pupils equal, round, reactive to light and accommodation. No scleral icterus. Extraocular muscles intact.  HEENT: Head atraumatic, normocephalic. Oropharynx and nasopharynx clear.  NECK:  Supple, no jugular venous distention. No thyroid enlargement, no tenderness.  LUNGS: Normal breath sounds bilaterally, no wheezing, rales,rhonchi or crepitation. No use of accessory muscles of respiration.  Decreased bibasilar breath sounds CARDIOVASCULAR: S1, S2 normal. No  rubs, or gallops.  3/6 systolic murmur is present ABDOMEN: Soft, nontender, nondistended.  Hypoactive bowel sounds present. No organomegaly or mass.  EXTREMITIES: No pedal edema, cyanosis, or clubbing.  NEUROLOGIC: Currently sedated PSYCHIATRIC: The patient is intubated and sedated SKIN: No obvious rash, lesion, or ulcer.    LABORATORY PANEL:   CBC Recent Labs  Lab 09/04/18 0550  WBC 21.2*  HGB 13.8  HCT 44.5  PLT 261    ------------------------------------------------------------------------------------------------------------------  Chemistries  Recent Labs  Lab 09/03/18 2137  09/05/18 0318 09/05/18 0408  NA 137   < > 139  --   K 4.2   < > 3.9  --   CL 106   < > 105  --   CO2 24   < > 24  --   GLUCOSE 161*   < > 103*  --   BUN 23   < > 24*  --   CREATININE 0.91   < > 1.03*  --   CALCIUM 8.6*   < > 8.3*  --   MG 1.9  --   --  1.8  AST 19  --   --   --   ALT 12  --   --   --   ALKPHOS 101  --   --   --   BILITOT 0.4  --   --   --    < > = values in this interval not displayed.   ------------------------------------------------------------------------------------------------------------------  Cardiac Enzymes Recent Labs  Lab 09/05/18 0408  TROPONINI 0.06*   ------------------------------------------------------------------------------------------------------------------  RADIOLOGY:  Dg Chest Port 1 View  Result Date: 09/04/2018 CLINICAL DATA:  Acute respiratory failure EXAM: PORTABLE CHEST 1 VIEW COMPARISON:  Chest radiograph 09/03/2018 FINDINGS: ET tube terminates in the mid trachea. Enteric tube courses inferior to the diaphragm. Monitoring leads overlie the patient. Stable cardiac and mediastinal contours. Persistent layering bilateral pleural effusions with underlying opacities. No pneumothorax. IMPRESSION: Sport apparatus as above. Layering effusions with underlying opacities. Electronically Signed   By: Annia Beltrew  Davis M.D.   On: 09/04/2018 08:00    EKG:   Orders  placed or performed during the hospital encounter of 2018-03-22  . ED EKG  . ED EKG  . EKG 12-Lead  . EKG 12-Lead  . EKG 12-Lead  . EKG 12-Lead    ASSESSMENT AND PLAN:   84 year old female with past medical history significant for A. fib not on anticoagulation, mild dementia, history of CHF unknown ejection fraction, diabetes and TIA who lives at home with her daughter was brought in secondary to difficulty  breathing  1.  Acute hypoxic respiratory failure-intubated and currently on ventilator -Likely causes cardiogenic shock. -Management per intensivist -Respiratory failure secondary CHF exacerbation -Currently on antibiotics.  If procalcitonin and cultures are negative-plan to discontinue antibiotics -PRN Lasix as needed since blood pressure is low. -Echocardiogram is with EF significantly reduced from last year -Needing pressors now.  Concern for cardiogenic shock,   cardiomyopathy -continue to  wean sedation and off the vent as tolerated  2.  CHF exacerbation-echocardiogram as outpatient from last year showing diastolic dysfunction with EF of 60 to 65%, echocardiogram this admission showing a drop in EF to 30% with multiple wall motion abnormalities and changes in valvular function as well. -Overall poor prognosis  -Currently on the ventilator -On   pressors.    3.  Diabetes mellitus-on sliding scale insulin.    4.  Mild dementia-according to son, still able to recognize family has mild dementia.  Ambulates with minimal assistance at baseline.  5.  DVT prophylaxis-Lovenox.  Palliative care consulted for goals of care conversation.   -Family to discuss with intensivist today.  Overall poor prognosis as not improving.    All the records are reviewed and case discussed with Care Management/Social Workerr. Management plans discussed with the patient, family and they are in agreement.  CODE STATUS: Full Code  TOTAL TIME TAKING CARE OF THIS PATIENT: 28 minutes.   POSSIBLE D/C IN ? DAYS, DEPENDING ON CLINICAL CONDITION.   Enid Baasadhika Sai Moura M.D on 09/05/2018 at 11:18 AM  Between 7am to 6pm - Pager - [phone]  After 6pm go to [email or web address] - password [email or web address]  [facility]  Office  [phone]  CC: Primary care physician; Patient, No Pcp Per

## 2018-09-05 NOTE — Progress Notes (Signed)
Follow up - Critical Care Medicine Note  Patient Details:    Dana MansonMargaret H Stifter is an 84 y.o. female. 84 y.o. Female admitted with Acute Hypoxic Respiratory failure requiring intubation in the ED in the setting of bilateral pleural effusions, and  A-fib with RVR. Lines, Airways, Drains: Airway 7.5 mm (Active)  Secured at (cm) 22 cm 09/04/2018  4:10 PM  Measured From Lips 09/04/2018  4:10 PM  Secured Location Right 09/04/2018  4:10 PM  Secured By [employer] Tube Holder 09/04/2018  4:10 PM  Tube Holder Repositioned Yes 09/04/2018  8:00 AM  Cuff Pressure (cm H2O) 20 cm H2O 09/04/2018 11:34 AM  Site Condition Dry 09/04/2018  7:45 AM     NG/OG Tube Orogastric 18 Fr. Center mouth Xray;Aucultation Documented cm marking at nare/ corner of mouth 51 cm (Active)  Cm Marking at Nare/Corner of Mouth (if applicable) 51 cm 09/04/2018  7:45 AM  Site Assessment Clean;Dry;Intact 09/04/2018  7:45 AM  Ongoing Placement Verification No change in cm markings or external length of tube from initial placement 09/04/2018  7:45 AM  Status Suction-low intermittent 09/04/2018  7:45 AM  Amount of suction 80 mmHg 09/04/2018  7:45 AM  Drainage Appearance Green;Yellow 09/04/2018  7:45 AM  Intake (mL) 150 mL 09/04/2018 10:55 AM  Output (mL) 5 mL 09/03/2018  6:00 PM     Urethral Catheter Erie NoeVanessa, RN Double-lumen 14 Fr. (Active)  Indication for Insertion or Continuance of Catheter Unstable critical patients (first 24-48 hours) 09/04/2018  7:45 AM  Site Assessment Clean;Intact 09/04/2018  7:45 AM  Catheter Maintenance Bag below level of bladder;Drainage bag/tubing not touching floor;Catheter secured;Insertion date on drainage bag;No dependent loops 09/04/2018  7:45 AM  Collection Container Standard drainage bag 09/04/2018  7:45 AM  Securement Method Securing device (Describe) 09/04/2018  7:45 AM  Urinary Catheter Interventions Unclamped 09/04/2018  7:45 AM  Input (mL) 150 mL 09/03/2018  4:25 AM  Output (mL) 100 mL  09/04/2018  4:49 PM    Anti-infectives:  Anti-infectives (From admission, onward)   Start     Dose/Rate Route Frequency Ordered Stop   09/04/18 0200  vancomycin (VANCOCIN) IVPB 1000 mg/200 mL premix  Status:  Discontinued     1,000 mg 200 mL/hr over 60 Minutes Intravenous Every 36 hours 09/03/18 0436 09/03/18 1022   09/03/18 1800  cefTRIAXone (ROCEPHIN) 1 g in sodium chloride 0.9 % 100 mL IVPB     1 g 200 mL/hr over 30 Minutes Intravenous Every 24 hours 09/03/18 1022     09/03/18 0245  ceFEPIme (MAXIPIME) 2 g in sodium chloride 0.9 % 100 mL IVPB  Status:  Discontinued     2 g 200 mL/hr over 30 Minutes Intravenous Every 24 hours 09/03/18 0241 09/03/18 1022   09/03/18 0245  vancomycin (VANCOCIN) IVPB 1000 mg/200 mL premix     1,000 mg 200 mL/hr over 60 Minutes Intravenous  Once 09/03/18 0241 09/03/18 0530   08/28/2018 1900  cefTRIAXone (ROCEPHIN) 1 g in sodium chloride 0.9 % 100 mL IVPB     1 g 200 mL/hr over 30 Minutes Intravenous  Once 08/09/2018 1855 08/21/2018 2013   08/27/2018 1900  azithromycin (ZITHROMAX) 500 mg in sodium chloride 0.9 % 250 mL IVPB     500 mg 250 mL/hr over 60 Minutes Intravenous  Once 08/28/2018 1855 09/01/2018 2134      Microbiology: Results for orders placed or performed during the hospital encounter of 09/04/2018  MRSA PCR Screening     Status: None   Collection  Time: 09/03/18  1:59 AM  Result Value Ref Range Status   MRSA by PCR NEGATIVE NEGATIVE Final    Comment:        The GeneXpert MRSA Assay (FDA approved for NASAL specimens only), is one component of a comprehensive MRSA colonization surveillance program. It is not intended to diagnose MRSA infection nor to guide or monitor treatment for MRSA infections. Performed at [facility], [address] [place], [place] [address]   Culture, respiratory (non-expectorated)     Status: None   Collection Time: 09/03/18  8:58 AM  Result Value Ref Range Status   Specimen Description   Final    TRACHEAL  ASPIRATE Performed at [facility], [address] [place], [place] [address]    Special Requests   Final    Normal Performed at [facility], [address] [place], [place] [address]    Gram Stain   Final    MODERATE WBC PRESENT, PREDOMINANTLY PMN RARE SQUAMOUS EPITHELIAL CELLS PRESENT RARE GRAM POSITIVE COCCI    Culture   Final    FEW Consistent with normal respiratory flora. Performed at [facility], [address] [place], [place] [address]    Report Status 09/05/2018 FINAL  Final    Best Practice/Protocols:  VTE Prophylaxis: Lovenox (prophylaxtic dose) GI Prophylaxis: Antihistamine Intermittent Sedation/ventilator management. ICU hypo/hyperglycemic protocol.  Events: 2D echo performed 12/28.  Studies: Ct Chest Wo Contrast  Result Date: September 09, 2018 CLINICAL DATA:  Dyspnea cough since Thanksgiving. EXAM: CT CHEST WITHOUT CONTRAST TECHNIQUE: Multidetector CT imaging of the chest was performed following the standard protocol without IV contrast. COMPARISON:  CXR 2018/09/09 and 09/15/2010 FINDINGS: Cardiovascular: Cardiomegaly with aortic atherosclerosis is noted with left main and three-vessel coronary arteriosclerosis. No pericardial effusion or thickening. The thoracic aorta is nonaneurysmal. Unenhanced pulmonary vasculature is unremarkable. Mediastinum/Nodes: No thyromegaly or mass. Atherosclerosis of the great vessels. Small subcentimeter short axis mediastinal nodes are noted. Assessment for hilar adenopathy is limited due to lack of IV contrast. Small anterior chest wall hypodense subcutaneous nodule likely representing small sebaceous cyst is noted measuring up to 2.2 cm. Lungs/Pleura: Left greater than right moderate pleural effusions with adjacent compressive atelectasis. Respiratory motion artifacts limit assessment. Subtle nodular density in the left upper lobe measuring 4 mm, series 3/45 is identified. Posterior segment right upper lobe  pulmonary consolidation is visualized, series 3/59 with air bronchograms. Atelectasis or pneumonia might account for this appearance. No pneumothorax is seen. Upper Abdomen: No acute abnormality. Musculoskeletal: Thoracic spondylosis withage-indeterminate likely chronic mild inferior endplate compression of T8 and superior endplate of T10. IMPRESSION: 1. Moderate left greater than right pleural effusions with adjacent compressive atelectasis. 2. Posterior segment right upper lobe pulmonary consolidation with air bronchograms may represent atelectasis or pneumonia. 3. 4 mm nodular density in the left upper lobe. No follow-up needed if patient is low-risk. Non-contrast chest CT can be considered in 12 months if patient is high-risk. This recommendation follows the consensus statement: Guidelines for Management of Incidental Pulmonary Nodules Detected on CT Images: From the Fleischner Society 2017; Radiology 2017; 284:228-243. 4. Thoracic spondylosis. Mild inferior endplate compression of T8 and superior endplate compression T10 likely remote. No retropulsion. Aortic Atherosclerosis (ICD10-I70.0). Electronically Signed   By: Tollie Eth M.D.   On: 09/09/18 20:09   Dg Chest Port 1 View  Result Date: 09/04/2018 CLINICAL DATA:  Acute respiratory failure EXAM: PORTABLE CHEST 1 VIEW COMPARISON:  Chest radiograph 09/03/2018 FINDINGS: ET tube terminates in the mid trachea.  Enteric tube courses inferior to the diaphragm. Monitoring leads overlie the patient. Stable cardiac and mediastinal contours. Persistent layering bilateral pleural effusions with underlying opacities. No pneumothorax. IMPRESSION: Sport apparatus as above. Layering effusions with underlying opacities. Electronically Signed   By: Annia Beltrew  Davis M.D.   On: 09/04/2018 08:00   Portable Chest Xray  Result Date: 09/03/2018 CLINICAL DATA:  Acute respiratory failure EXAM: PORTABLE CHEST 1 VIEW COMPARISON:  09/02/2017 FINDINGS: Endotracheal tube terminates  2.5 cm above the carina. No frank interstitial edema. Suspected small bilateral pleural effusions, right greater than left. No pneumothorax. Cardiomegaly. Degenerative changes of the thoracic spine. IMPRESSION: Endotracheal tube terminates 2.5 cm above the carina. Suspected small bilateral pleural effusions, right greater than left. Electronically Signed   By: Charline BillsSriyesh  Krishnan M.D.   On: 09/03/2018 05:48   Dg Chest Portable 1 View  Result Date: 08/17/2018 CLINICAL DATA:  Respiratory failure. Status post intubation. EXAM: PORTABLE CHEST 1 VIEW COMPARISON:  Same day chest CT and CXR FINDINGS: New endotracheal tube tip is identified approximately 3.2 cm above the carina in satisfactory position. Right greater than left pleural effusions with atelectasis and interstitial edema is noted. Aortic atherosclerosis is identified. No acute osseous appearing abnormality. IMPRESSION: 1. Satisfactory endotracheal tube position. 2. Stable cardiomegaly and aortic atherosclerosis. 3. Right greater than left pleural effusions with atelectasis and interstitial edema. Electronically Signed   By: Tollie Ethavid  Kwon M.D.   On: 08/21/2018 21:58   Dg Chest Portable 1 View  Result Date: 09/05/2018 CLINICAL DATA:  Hypoxia, shortness of Breath EXAM: PORTABLE CHEST 1 VIEW COMPARISON:  07/17/2017 FINDINGS: Cardiomegaly with vascular congestion. Moderate bilateral pleural effusions with bibasilar atelectasis or infiltrates. No acute bony abnormality. IMPRESSION: Cardiomegaly with vascular congestion. Moderate effusions with bibasilar atelectasis or pneumonia. Electronically Signed   By: Charlett NoseKevin  Dover M.D.   On: 09/05/2018 18:14   Dg Abd Portable 1 View  Result Date: 09/01/2018 CLINICAL DATA:  Check gastric catheter placement EXAM: PORTABLE ABDOMEN - 1 VIEW COMPARISON:  Chest x-ray from earlier in the same day. FINDINGS: Gastric catheter is noted within the stomach. Bilateral pleural effusions are again noted similar to that seen on  recent chest x-ray. Degenerative changes of the lumbar spine is noted. IMPRESSION: Gastric catheter within the stomach. Bilateral pleural effusions. Electronically Signed   By: Alcide CleverMark  Lukens M.D.   On: 08/28/2018 23:46   Echocardiogram results available today: Her ejection fraction has dropped to 30 to 35% from a prior ejection fraction of 60 to 65% noted in 2018.  An echo of 2018 also showed that she had grade 3 diastolic dysfunction now she has global hypokinesis and systolic component.  She has moderate pulmonary hypertension, previously this was mild.  A prior cardiac catheterization performed in 2007 indicated that she had significant coronary artery disease but was being treated medically.  Consults:    Subjective:    Overnight Issues:  Blood pressure remains labile as well as heart rate.  She requires sedation otherwise becomes agitated and heart rate deteriorates decreasing her blood pressure.  Discussed with granddaughter and daughter at bedside.  Granddaughter understands the severity of the situation. Objective:  Vital signs for last 24 hours: Temp:  [97.6 F (36.4 C)-99.6 F (37.6 C)] 98.3 F (36.8 C) (12/29 1730) Pulse Rate:  [36-123] 94 (12/29 1845) Resp:  [16-19] 16 (12/29 1845) BP: (73-129)/(44-83) 116/68 (12/29 1845) SpO2:  [92 %-100 %] 100 % (12/29 1913) FiO2 (%):  [35 %] 35 % (12/29 1913)  Hemodynamic parameters for last  24 hours:  Reviewed  Intake/Output from previous day: 12/28 0701 - 12/29 0700 In: 1189.9 [I.V.:939.9; NG/GT:150; IV Piggyback:100] Out: 715 [Urine:715]  Intake/Output this shift: No intake/output data recorded.  Vent settings for last 24 hours: Vent Mode: PRVC FiO2 (%):  [35 %] 35 % Set Rate:  [16 bmp] 16 bmp Vt Set:  [400 mL] 400 mL PEEP:  [5 cmH20] 5 cmH20 Plateau Pressure:  [17 cmH20-19 cmH20] 17 cmH20  Physical Exam:  General:   Frail, chronically ill appearing female, laying in bed, intubated, sedated, thin. Neuro:  Sedated, withdraws  from pain, Pupils PERRL 3 mm sluggish bilaterally.  When sedation lightened she will get agitated. HEENT:  Atraumatic, normocephalic, neck supple, JVD, ETT in place Cardiovascular:  Irregulary irregular rhythm, rate controlled, grade 2/6 systolic ejection murmur at the left sternal border. Lungs:   Rales at the bases with diminished breath sounds at the bases  Abdomen:  Soft, nontender, nondistended, no grimacing when palpated no guarding, BS+ x4 Musculoskeletal:  No deformities, normal bulk and tone, 2+ edema bilateral LE Skin:  Warm/dry.  No obvious rashes, lesions, or ulcerations  Assessment/Plan:   1.  Acute hypoxic respiratory failure, ventilator dependent caused by decompensation of systolic heart failure and A. fib with RVR: Continue ventilator support, have attempted to wean patient however becomes very agitated when sedation is lightened.  Reassess in the morning. I have discussed the case in detail with the patient's granddaughter who has medical background and understands the situation.  She has discussed with the rest of the family and they desire the patient to be DNR.  They are gathering other family members with the intention of getting palliative care involved in the morning and transition to comfort care.  2.  Cardiomyopathy likely ischemic: She has had decompensation, we were able to obtain copy of echocardiogram performed in 2018 in [place].  The patient at that time had findings consistent with significant diastolic dysfunction (grade III), mild pulmonary hypertension and ejection fraction of 60 to 65%.  Currently her ejection fraction is 30 to 35%.  She also has significant valvular disease both aortic stenosis and mitral regurgitation.  Disposition as above.   3.  Atrial fibrillation with RVR: She has been better controlled with as needed metoprolol.  4.  Bilateral pleural effusions: These are due to decompensated heart failure, she has been difficult to diurese due to labile  blood pressures.    5.  Diabetes mellitus with hyperglycemia: Continue to monitor CBGs, she is on sliding scale insulin, continue ICU hypo-/hyperglycemic protocol.  6.  No evidence of acute infectious process.  Discontinue antibiotics.  7.  Agitation when sedation discontinued: Patient is at high risk for ICU delirium continue to limit use of benzodiazepines and narcotics.  She will likely be extubated to comfort so this likely will not be an issue.  8.  End-of-life issues: As noted, I have discussed with the patient's granddaughter who has medical background and understands the gravity of the situation.  She has been turned discussed with her father and aunt and the decision is to make the patient DNR and discuss about extubation to comfort in the morning.   LOS: 3 days   Additional comments: I have personally reviewed all of the new lab data.  Reviewed results of the 2D echo.  Discussed and updated patient's situation with the family.  Critical Care Total Time*: 40 Minutes  Sarina SerCarmen Kinta Martis 09/05/2018  *Care during the described time interval was provided by me and/or other  providers on the critical care team.  I have reviewed this patient's available data, including medical history, events of note, physical examination and test results as part of my evaluation.

## 2018-09-06 ENCOUNTER — Inpatient Hospital Stay: Payer: Medicare Other

## 2018-09-06 LAB — GLUCOSE, CAPILLARY
GLUCOSE-CAPILLARY: 87 mg/dL (ref 70–99)
Glucose-Capillary: 151 mg/dL — ABNORMAL HIGH (ref 70–99)
Glucose-Capillary: 181 mg/dL — ABNORMAL HIGH (ref 70–99)
Glucose-Capillary: 184 mg/dL — ABNORMAL HIGH (ref 70–99)
Glucose-Capillary: 205 mg/dL — ABNORMAL HIGH (ref 70–99)
Glucose-Capillary: 25 mg/dL — CL (ref 70–99)
Glucose-Capillary: 69 mg/dL — ABNORMAL LOW (ref 70–99)
Glucose-Capillary: 75 mg/dL (ref 70–99)
Glucose-Capillary: 86 mg/dL (ref 70–99)

## 2018-09-06 LAB — BASIC METABOLIC PANEL
Anion gap: 12 (ref 5–15)
BUN: 23 mg/dL (ref 8–23)
CO2: 19 mmol/L — ABNORMAL LOW (ref 22–32)
Calcium: 8.4 mg/dL — ABNORMAL LOW (ref 8.9–10.3)
Chloride: 107 mmol/L (ref 98–111)
Creatinine, Ser: 0.88 mg/dL (ref 0.44–1.00)
GFR calc Af Amer: >60 mL/min (ref >60)
GFR calc non Af Amer: 56 mL/min — ABNORMAL LOW (ref >60)
Glucose, Bld: 101 mg/dL — ABNORMAL HIGH (ref 70–99)
Potassium: 3.9 mmol/L (ref 3.5–5.1)
Sodium: 138 mmol/L (ref 135–145)

## 2018-09-06 LAB — MAGNESIUM: Magnesium: 1.7 mg/dL (ref 1.7–2.4)

## 2018-09-06 MED ORDER — ENOXAPARIN SODIUM 40 MG/0.4ML ~~LOC~~ SOLN
40.0000 mg | SUBCUTANEOUS | Status: DC
  Administered 2018-09-06 – 2018-09-07 (×2): 40 mg via SUBCUTANEOUS
  Filled 2018-09-06 (×2): qty 0.4

## 2018-09-06 MED ORDER — AMIODARONE HCL IN DEXTROSE 360-4.14 MG/200ML-% IV SOLN
30.0000 mg/h | INTRAVENOUS | Status: DC
  Administered 2018-09-06 – 2018-09-08 (×5): 30 mg/h via INTRAVENOUS
  Filled 2018-09-06 (×4): qty 200

## 2018-09-06 MED ORDER — AMIODARONE LOAD VIA INFUSION
150.0000 mg | Freq: Once | INTRAVENOUS | Status: AC
  Administered 2018-09-06: 150 mg via INTRAVENOUS
  Filled 2018-09-06: qty 83.34

## 2018-09-06 MED ORDER — AMIODARONE HCL IN DEXTROSE 360-4.14 MG/200ML-% IV SOLN
60.0000 mg/h | INTRAVENOUS | Status: AC
  Administered 2018-09-06 – 2018-09-08 (×3): 60 mg/h via INTRAVENOUS
  Filled 2018-09-06 (×2): qty 200

## 2018-09-06 MED ORDER — DEXMEDETOMIDINE HCL IN NACL 400 MCG/100ML IV SOLN
0.4000 ug/kg/h | INTRAVENOUS | Status: DC
  Administered 2018-09-06 – 2018-09-08 (×3): 0.4 ug/kg/h via INTRAVENOUS
  Filled 2018-09-06 (×3): qty 100

## 2018-09-06 MED ORDER — MAGNESIUM SULFATE 2 GM/50ML IV SOLN
2.0000 g | Freq: Once | INTRAVENOUS | Status: AC
  Administered 2018-09-06: 2 g via INTRAVENOUS
  Filled 2018-09-06: qty 50

## 2018-09-06 MED ORDER — DEXTROSE 50 % IV SOLN
1.0000 | Freq: Once | INTRAVENOUS | Status: AC
  Administered 2018-09-06: 50 mL via INTRAVENOUS
  Filled 2018-09-06: qty 50

## 2018-09-06 NOTE — Progress Notes (Signed)
Patient's heart rate elevated afib rate 140-150's on amio drip and patient has been off sedation, not agitated.  MD gave order to start patient on precedex drip.

## 2018-09-06 NOTE — Progress Notes (Signed)
Blood glucose was 69 and amp of d50 given per Dr. Lutricia Horsfallonforti's order.  Blood glucose rechecked and is now 150 after amp given.

## 2018-09-06 NOTE — Progress Notes (Signed)
PHARMACY NOTE:  ANTIMICOAGULANT RENAL DOSAGE ADJUSTMENT  Current anticoagulant regimen includes a mismatch between anticoagulant dosage and estimated renal function.  As per policy approved by the Pharmacy & Therapeutics and Medical Executive Committees, the anticoagulant dosage will be adjusted accordingly.  Current anticoagulant dosage:  Enoxaparin 30mg  SQ Q24hr.   Indication: DVT Prophylaxis  Renal Function:  Estimated Creatinine Clearance: 30.9 mL/min (by C-G formula based on SCr of 0.88 mg/dL). Body mass index is 22.98 kg/m.    Anticoagulant dosage has been changed to:  Enoxaparin 40mg  SQ Q24hr.   Thank you for allowing pharmacy to be a part of this patient's care.  Gissela Bloch L, [facility] CenterRPH 09/06/2018 8:59 AM

## 2018-09-06 NOTE — Progress Notes (Signed)
   09/06/18 1035  Clinical Encounter Type  Visited With Patient and family together  Visit Type Follow-up;Spiritual support  Spiritual Encounters  Spiritual Needs Emotional   Chaplain followed up with patient's family, offering emotional support to patient's daughter Fannie KneeSue.  Chaplain and Fannie KneeSue engaged in life review regarding patient's personality (someone who enjoyed being 'on the go') and the role that faith played in her life.  Family open to ongoing chaplain support.

## 2018-09-06 NOTE — Progress Notes (Signed)
Dr. Lonn Georgiaonforti aware that metoprolol was given for elevated heart rate and is now better controlled.  MD gave order to start amiodarone drip with bolus if patient's heart rate goes back up and sustains again.

## 2018-09-06 NOTE — Progress Notes (Signed)
Follow up - Critical Care Medicine Note  Patient Details:    Dana MansonMargaret H Griffith is an 84 y.o. female.admitted with Acute Hypoxic Respiratory failure requiring intubation in the ED in the setting of bilateral pleural effusions, and  A-fib with RVR.  Patient noted on echocardiogram to have ejection fraction reduced at 30 to 35%.  diastolic dysfunction and global hypokinesia with pulmonary hypertension.  Lines, Airways, Drains: Airway 7.5 mm (Active)  Secured at (cm) 22 cm 09/06/2018  3:45 AM  Measured From Lips 09/06/2018  3:45 AM  Secured Location Center 09/06/2018  3:45 AM  Secured By [employer] Tube Holder 09/06/2018  3:45 AM  Tube Holder Repositioned Yes 09/06/2018  3:13 AM  Cuff Pressure (cm H2O) 24 cm H2O 09/05/2018  7:13 PM  Site Condition Dry 09/06/2018  3:45 AM     NG/OG Tube Orogastric 18 Fr. Center mouth Xray;Aucultation Documented cm marking at nare/ corner of mouth 51 cm (Active)  Cm Marking at Nare/Corner of Mouth (if applicable) 51 cm 09/06/2018  3:45 AM  Site Assessment Clean;Dry;Intact 09/06/2018  3:45 AM  Ongoing Placement Verification No change in cm markings or external length of tube from initial placement 09/06/2018  3:45 AM  Status Suction-low intermittent 09/06/2018  3:45 AM  Amount of suction 80 mmHg 09/06/2018  3:45 AM  Drainage Appearance Other (Comment) 09/06/2018  3:45 AM  Intake (mL) 60 mL 09/05/2018 11:27 AM  Output (mL) 5 mL 09/03/2018  6:00 PM     Urethral Catheter Erie NoeVanessa, RN Double-lumen 14 Fr. (Active)  Indication for Insertion or Continuance of Catheter Unstable critical patients (first 24-48 hours) 09/06/2018  3:45 AM  Site Assessment Clean;Intact 09/06/2018  3:45 AM  Catheter Maintenance Bag below level of bladder;Catheter secured;Drainage bag/tubing not touching floor;Insertion date on drainage bag;No dependent loops;Seal intact 09/06/2018  3:45 AM  Collection Container Standard drainage bag 09/06/2018  3:45 AM  Securement Method Securing device  (Describe) 09/06/2018  3:45 AM  Urinary Catheter Interventions Unclamped 09/06/2018  3:45 AM  Input (mL) 150 mL 09/03/2018  4:25 AM  Output (mL) 75 mL 09/06/2018  6:28 AM    Anti-infectives:  Anti-infectives (From admission, onward)   Start     Dose/Rate Route Frequency Ordered Stop   09/04/18 0200  vancomycin (VANCOCIN) IVPB 1000 mg/200 mL premix  Status:  Discontinued     1,000 mg 200 mL/hr over 60 Minutes Intravenous Every 36 hours 09/03/18 0436 09/03/18 1022   09/03/18 1800  cefTRIAXone (ROCEPHIN) 1 g in sodium chloride 0.9 % 100 mL IVPB  Status:  Discontinued     1 g 200 mL/hr over 30 Minutes Intravenous Every 24 hours 09/03/18 1022 09/05/18 1951   09/03/18 0245  ceFEPIme (MAXIPIME) 2 g in sodium chloride 0.9 % 100 mL IVPB  Status:  Discontinued     2 g 200 mL/hr over 30 Minutes Intravenous Every 24 hours 09/03/18 0241 09/03/18 1022   09/03/18 0245  vancomycin (VANCOCIN) IVPB 1000 mg/200 mL premix     1,000 mg 200 mL/hr over 60 Minutes Intravenous  Once 09/03/18 0241 09/03/18 0530   2018-08-31 1900  cefTRIAXone (ROCEPHIN) 1 g in sodium chloride 0.9 % 100 mL IVPB     1 g 200 mL/hr over 30 Minutes Intravenous  Once 2018-08-31 1855 2018-08-31 2013   2018-08-31 1900  azithromycin (ZITHROMAX) 500 mg in sodium chloride 0.9 % 250 mL IVPB     500 mg 250 mL/hr over 60 Minutes Intravenous  Once 2018-08-31 1855 2018-08-31 2134  Microbiology: Results for orders placed or performed during the hospital encounter of 08/14/2018  MRSA PCR Screening     Status: None   Collection Time: 09/03/18  1:59 AM  Result Value Ref Range Status   MRSA by PCR NEGATIVE NEGATIVE Final    Comment:        The GeneXpert MRSA Assay (FDA approved for NASAL specimens only), is one component of a comprehensive MRSA colonization surveillance program. It is not intended to diagnose MRSA infection nor to guide or monitor treatment for MRSA infections. Performed at [facility], [address]  [place], [place] [address]   Culture, respiratory (non-expectorated)     Status: None   Collection Time: 09/03/18  8:58 AM  Result Value Ref Range Status   Specimen Description   Final    TRACHEAL ASPIRATE Performed at [facility], [address], [place], [place] [address]    Special Requests   Final    Normal Performed at [facility], [address] [place], [place] [address]    Gram Stain   Final    MODERATE WBC PRESENT, PREDOMINANTLY PMN RARE SQUAMOUS EPITHELIAL CELLS PRESENT RARE GRAM POSITIVE COCCI    Culture   Final    FEW Consistent with normal respiratory flora. Performed at [facility], [address] [place], [place] [address]    Report Status 09/05/2018 FINAL  Final    Studies: Ct Chest Wo Contrast  Result Date: 08/18/2018 CLINICAL DATA:  Dyspnea cough since Thanksgiving. EXAM: CT CHEST WITHOUT CONTRAST TECHNIQUE: Multidetector CT imaging of the chest was performed following the standard protocol without IV contrast. COMPARISON:  CXR 08/08/2018 and 09/15/2010 FINDINGS: Cardiovascular: Cardiomegaly with aortic atherosclerosis is noted with left main and three-vessel coronary arteriosclerosis. No pericardial effusion or thickening. The thoracic aorta is nonaneurysmal. Unenhanced pulmonary vasculature is unremarkable. Mediastinum/Nodes: No thyromegaly or mass. Atherosclerosis of the great vessels. Small subcentimeter short axis mediastinal nodes are noted. Assessment for hilar adenopathy is limited due to lack of IV contrast. Small anterior chest wall hypodense subcutaneous nodule likely representing small sebaceous cyst is noted measuring up to 2.2 cm. Lungs/Pleura: Left greater than right moderate pleural effusions with adjacent compressive atelectasis. Respiratory motion artifacts limit assessment. Subtle nodular density in the left upper lobe measuring 4 mm, series 3/45 is identified. Posterior segment right upper lobe pulmonary consolidation is  visualized, series 3/59 with air bronchograms. Atelectasis or pneumonia might account for this appearance. No pneumothorax is seen. Upper Abdomen: No acute abnormality. Musculoskeletal: Thoracic spondylosis withage-indeterminate likely chronic mild inferior endplate compression of T8 and superior endplate of T10. IMPRESSION: 1. Moderate left greater than right pleural effusions with adjacent compressive atelectasis. 2. Posterior segment right upper lobe pulmonary consolidation with air bronchograms may represent atelectasis or pneumonia. 3. 4 mm nodular density in the left upper lobe. No follow-up needed if patient is low-risk. Non-contrast chest CT can be considered in 12 months if patient is high-risk. This recommendation follows the consensus statement: Guidelines for Management of Incidental Pulmonary Nodules Detected on CT Images: From the Fleischner Society 2017; Radiology 2017; 284:228-243. 4. Thoracic spondylosis. Mild inferior endplate compression of T8 and superior endplate compression T10 likely remote. No retropulsion. Aortic Atherosclerosis (ICD10-I70.0). Electronically Signed   By: Tollie Eth M.D.   On: 08/28/2018 20:09   Dg Chest Port 1 View  Result Date: 09/04/2018 CLINICAL DATA:  Acute respiratory failure EXAM: PORTABLE CHEST 1 VIEW COMPARISON:  Chest radiograph 09/03/2018 FINDINGS: ET tube terminates in the  mid trachea. Enteric tube courses inferior to the diaphragm. Monitoring leads overlie the patient. Stable cardiac and mediastinal contours. Persistent layering bilateral pleural effusions with underlying opacities. No pneumothorax. IMPRESSION: Sport apparatus as above. Layering effusions with underlying opacities. Electronically Signed   By: Annia Beltrew  Davis M.D.   On: 09/04/2018 08:00   Portable Chest Xray  Result Date: 09/03/2018 CLINICAL DATA:  Acute respiratory failure EXAM: PORTABLE CHEST 1 VIEW COMPARISON:  09/02/2017 FINDINGS: Endotracheal tube terminates 2.5 cm above the carina. No  frank interstitial edema. Suspected small bilateral pleural effusions, right greater than left. No pneumothorax. Cardiomegaly. Degenerative changes of the thoracic spine. IMPRESSION: Endotracheal tube terminates 2.5 cm above the carina. Suspected small bilateral pleural effusions, right greater than left. Electronically Signed   By: Charline BillsSriyesh  Krishnan M.D.   On: 09/03/2018 05:48   Dg Chest Portable 1 View  Result Date: 09/01/2018 CLINICAL DATA:  Respiratory failure. Status post intubation. EXAM: PORTABLE CHEST 1 VIEW COMPARISON:  Same day chest CT and CXR FINDINGS: New endotracheal tube tip is identified approximately 3.2 cm above the carina in satisfactory position. Right greater than left pleural effusions with atelectasis and interstitial edema is noted. Aortic atherosclerosis is identified. No acute osseous appearing abnormality. IMPRESSION: 1. Satisfactory endotracheal tube position. 2. Stable cardiomegaly and aortic atherosclerosis. 3. Right greater than left pleural effusions with atelectasis and interstitial edema. Electronically Signed   By: Tollie Ethavid  Kwon M.D.   On: 08/21/2018 21:58   Dg Chest Portable 1 View  Result Date: 08/16/2018 CLINICAL DATA:  Hypoxia, shortness of Breath EXAM: PORTABLE CHEST 1 VIEW COMPARISON:  07/17/2017 FINDINGS: Cardiomegaly with vascular congestion. Moderate bilateral pleural effusions with bibasilar atelectasis or infiltrates. No acute bony abnormality. IMPRESSION: Cardiomegaly with vascular congestion. Moderate effusions with bibasilar atelectasis or pneumonia. Electronically Signed   By: Charlett NoseKevin  Dover M.D.   On: 08/20/2018 18:14   Dg Abd Portable 1 View  Result Date: 08/11/2018 CLINICAL DATA:  Check gastric catheter placement EXAM: PORTABLE ABDOMEN - 1 VIEW COMPARISON:  Chest x-ray from earlier in the same day. FINDINGS: Gastric catheter is noted within the stomach. Bilateral pleural effusions are again noted similar to that seen on recent chest x-ray. Degenerative  changes of the lumbar spine is noted. IMPRESSION: Gastric catheter within the stomach. Bilateral pleural effusions. Electronically Signed   By: Alcide CleverMark  Lukens M.D.   On: 08/25/2018 23:46    Subjective:    Overnight Issues: Overnight patient remains intubated on mechanical ventilation, controlled ventricular response.  Objective:  Vital signs for last 24 hours: Temp:  [98 F (36.7 C)-99.3 F (37.4 C)] 98.6 F (37 C) (12/30 0745) Pulse Rate:  [49-101] 71 (12/30 0730) Resp:  [16-19] 16 (12/30 0815) BP: (70-148)/(42-76) 78/56 (12/30 0815) SpO2:  [93 %-100 %] 100 % (12/30 0800) FiO2 (%):  [35 %] 35 % (12/30 0345) Weight:  [57 kg] 57 kg (12/30 0500)  Hemodynamic parameters for last 24 hours:    Intake/Output from previous day: 12/29 0701 - 12/30 0700 In: 1483.6 [I.V.:1323.6; NG/GT:60; IV Piggyback:100.1] Out: 500 [Urine:500]  Intake/Output this shift: Total I/O In: 48.2 [I.V.:48.2] Out: -   Vent settings for last 24 hours: Vent Mode: PRVC FiO2 (%):  [35 %] 35 % Set Rate:  [16 bmp] 16 bmp Vt Set:  [400 mL] 400 mL PEEP:  [5 cmH20] 5 cmH20 Plateau Pressure:  [17 cmH20-20 cmH20] 20 cmH20  Physical Exam:  General: Frail, chronically ill appearing female, laying in bed, intubated, sedated, thin. Neuro:Sedated, withdraws from pain, Pupils PERRL  3 mm sluggish bilaterally.  When sedation lightened she will get agitated. HEENT:Atraumatic, normocephalic, neck supple, JVD, ETT in place Cardiovascular:Irregulary irregular rhythm, rate controlled, grade 2/6 systolic ejection murmur at the left sternal border. Lungs: Rales at the bases with diminished breath sounds at the bases  Abdomen:Soft, nontender, nondistended, no grimacing when palpated no guarding, BS+ x4 Musculoskeletal:No deformities, normal bulk and tone, 2+ edema bilateral LE Skin:Warm/dry. No obvious rashes, lesions, or ulcerations  Assessment/Plan:  Acute hypoxic respiratory failure, ventilator dependent  caused by decompensation of systolic heart failure and A. fib with RVR: Continue ventilator support.  Family discussions per Dr. Jayme Cloud, pending palliative care consultation today to help further delineate goals of care   Cardiomyopathy likely ischemic: She has had decompensation, we were able to obtain copy of echocardi[place] in [place].  The patient at that time had findings consistent with significant diastolic dysfunction (grade III), mild pulmonary hypertension and ejection fraction of 60 to 65%.  Currently her ejection fraction is 30 to 35%.  She also has significant valvular disease both aortic stenosis and mitral regurgitation.   Atrial fibrillation with RVR:  Patient has had varying ventricular response control.  Is on as needed Lopressor however has been hypotensive presently requiring Neo-Synephrine.  May need amiodarone if rapid ventricular response continues  Bilateral pleural effusions: These are due to decompensated heart failure, she has been difficult to diurese due to labile blood pressures.    Diabetes mellitus with hyperglycemia: Continue to monitor CBGs, she is on sliding scale insulin, continue ICU hypo-/hyperglycemic protocol.  Agitation when sedation discontinued: Patient is at high risk for ICU delirium continue to limit use of benzodiazepines and narcotics.  She will likely be extubated to comfort so this likely will not be an issue.  End-of-life issues:  Patient is presently a DNR, pending palliative care consultation  Critical Care Total Time 40 minutes  Jaymen Fetch 09/06/2018  *Care during the described time interval was provided by me and/or other providers on the critical care team.  I have reviewed this patient's available data, including medical history, events of note, physical examination and test results as part of my evaluation.

## 2018-09-06 NOTE — Progress Notes (Signed)
[facility] [place] [facility]   PATIENT NAME: Dana Griffith    MR#:  [id number]  DATE OF BIRTH:  October 31, 1921  SUBJECTIVE:  CHIEF COMPLAINT:   Chief Complaint  Patient presents with  . Shortness of Breath   -No significant change from yesterday.  More tachycardic today.  Remains on sedation and also phenylephrine drip  REVIEW OF SYSTEMS:  Review of Systems  Unable to perform ROS: Critical illness    DRUG ALLERGIES:   Allergies  Allergen Reactions  . Niaspan [Niacin Er] Other (See Comments)    Severe flushing, redness of face as if sunburned    VITALS:  Blood pressure (!) 95/55, pulse 87, temperature 98.6 F (37 C), temperature source Axillary, resp. rate 16, height 5\' 2"  (1.575 m), weight 57 kg, SpO2 100 %.  PHYSICAL EXAMINATION:  Physical Exam  GENERAL:  84 y.o.-year-old elderly patient lying in the bed, critically ill-appearing  EYES: Pupils equal, round, reactive to light and accommodation. No scleral icterus. Extraocular muscles intact.  HEENT: Head atraumatic, normocephalic. Oropharynx and nasopharynx clear.  NECK:  Supple, no jugular venous distention. No thyroid enlargement, no tenderness.  LUNGS: Normal breath sounds bilaterally, no wheezing, rales,rhonchi or crepitation. No use of accessory muscles of respiration.  Decreased bibasilar breath sounds CARDIOVASCULAR: S1, S2 normal. No  rubs, or gallops.  3/6 systolic murmur is present ABDOMEN: Soft, nontender, nondistended.  Hypoactive bowel sounds present. No organomegaly or mass.  EXTREMITIES: No pedal edema, cyanosis, or clubbing.  NEUROLOGIC: Currently sedated PSYCHIATRIC: The patient is intubated and sedated SKIN: No obvious rash, lesion, or ulcer.    LABORATORY PANEL:   CBC Recent Labs  Lab 09/04/18 0550  WBC 21.2*  HGB 13.8  HCT 44.5  PLT 261    ------------------------------------------------------------------------------------------------------------------  Chemistries  Recent Labs  Lab 09/03/18 2137  09/06/18 0424  NA 137   < > 138  K 4.2   < > 3.9  CL 106   < > 107  CO2 24   < > 19*  GLUCOSE 161*   < > 101*  BUN 23   < > 23  CREATININE 0.91   < > 0.88  CALCIUM 8.6*   < > 8.4*  MG 1.9   < > 1.7  AST 19  --   --   ALT 12  --   --   ALKPHOS 101  --   --   BILITOT 0.4  --   --    < > = values in this interval not displayed.   ------------------------------------------------------------------------------------------------------------------  Cardiac Enzymes Recent Labs  Lab 09/05/18 0408  TROPONINI 0.06*   ------------------------------------------------------------------------------------------------------------------  RADIOLOGY:  No results found.  EKG:   Orders placed or performed during the hospital encounter of Aug 10, 2018  . ED EKG  . ED EKG  . EKG 12-Lead  . EKG 12-Lead  . EKG 12-Lead  . EKG 12-Lead    ASSESSMENT AND PLAN:   84 year old female with past medical history significant for A. fib not on anticoagulation, mild dementia, history of CHF unknown ejection fraction, diabetes and TIA who lives at home with her daughter was brought in secondary to difficulty breathing  1.  Acute hypoxic respiratory failure-intubated and currently on ventilator -Likely cause is cardiogenic shock. -Management per intensivist -Respiratory failure secondary CHF exacerbation -Was started on antibiotics for presumed pneumonia.  However since procalcitonin is negative and blood cultures are negative, Rocephin has been discontinued. -PRN Lasix as needed since blood pressure is low. -  Echocardiogram is with EF significantly reduced from last year -Needing pressors now.  Concern for cardiogenic shock,   cardiomyopathy -continue to  wean sedation and off the vent as tolerated  2.  CHF exacerbation-echocardiogram as  outpatient from last year showing diastolic dysfunction with EF of 60 to 65%, echocardiogram this admission showing a drop in EF to 30% with multiple wall motion abnormalities and changes in valvular function as well. -Overall poor prognosis  -Currently on the ventilator -On   pressors.    3.  Diabetes mellitus-on sliding scale insulin.    4.  Mild dementia-according to son, still able to recognize family has mild dementia.  Ambulates with minimal assistance at baseline.  5.  DVT prophylaxis-Lovenox.  6.  Sinus tachycardia-with some PVCs in between-continue to monitor.  Remains on phenylephrine drip.  If worsening arrhythmias, consider amiodarone for a short time  Palliative care consulted for goals of care conversation.   -CODE STATUS changed to DNR.    All the records are reviewed and case discussed with Care Management/Social Workerr. Management plans discussed with the patient, family and they are in agreement.  CODE STATUS: DNR  TOTAL TIME TAKING CARE OF THIS PATIENT: 28 minutes.   POSSIBLE D/C IN ? DAYS, DEPENDING ON CLINICAL CONDITION.   Enid Baasadhika Gerasimos Plotts M.D on 09/06/2018 at 9:52 AM  Between 7am to 6pm - Pager - [phone]  After 6pm go to [email or web address] - password [email or web address]  [facility]  Office  [phone]  CC: Primary care physician; Patient, No Pcp Per

## 2018-09-06 NOTE — Progress Notes (Signed)
[place]  [phone] [phone]   Name: Dana Griffith Date: 09/06/2018 MRN: [id number]  DOB: Jan 13, 1922  Patient Care Team: Patient, No Pcp Per as PCP - General (General Practice) System, Pcp Not In    REASON FOR CONSULTATION: Palliative Care consult requested for this 84 y.o. female with multiple medical problems including mild dementia, afib not on anticoagulation, h/o TIA, h/o CHF, and DM. Patient was admitted to the hosptial on 08/27/2018 with VDRF secondary to PNA and afib with RVR. CT of the chest revealed RUL consolidation and B. Pleural effusions (R>L). Patient apparently had been treated in the outpatient setting for PNA but had clinically declined. Palliative care was consulted to help address goals.   CODE STATUS: DNR  PAST MEDICAL HISTORY: Past Medical History:  Diagnosis Date  . Atrial fibrillation ([place])   . Dementia ([place])   . Diabetes mellitus without complication ([place])   . Hyperlipemia   . SIADH (syndrome of inappropriate ADH production) ([place]) 2012  . TIA (transient ischemic attack)     PAST SURGICAL HISTORY:  Past Surgical History:  Procedure Laterality Date  . CATARACT EXTRACTION Bilateral   . CHOLECYSTECTOMY      HEMATOLOGY/ONCOLOGY HISTORY:   No history exists.    ALLERGIES:  is allergic to niaspan [niacin er].  MEDICATIONS:  Current Facility-Administered Medications  Medication Dose Route Frequency Provider Last Rate Last Dose  . 0.9 %  sodium chloride infusion   Intravenous PRN Tukov-Yual, Arlyss Gandy, NP   Stopped at 09/05/18 1733  . amiodarone (NEXTERONE PREMIX) 360-4.14 MG/200ML-% (1.8 mg/mL) IV infusion  60 mg/hr Intravenous Continuous Conforti, John, DO 33.3 mL/hr at 09/06/18 1400 60 mg/hr at 09/06/18 1400   Followed by  . amiodarone (NEXTERONE PREMIX) 360-4.14 MG/200ML-% (1.8 mg/mL) IV infusion  30 mg/hr Intravenous Continuous Conforti, John, DO      . bisacodyl  (DULCOLAX) suppository 10 mg  10 mg Rectal Daily PRN Tukov-Yual, Magdalene S, NP      . chlorhexidine gluconate (MEDLINE KIT) (PERIDEX) 0.12 % solution 15 mL  15 mL Mouth Rinse BID Darel Hong D, NP   15 mL at 09/06/18 0748  . dexmedetomidine (PRECEDEX) 400 MCG/100ML (4 mcg/mL) infusion  0.4-1.2 mcg/kg/hr Intravenous Titrated Conforti, John, DO 5.7 mL/hr at 09/06/18 1406 0.4 mcg/kg/hr at 09/06/18 1406  . enoxaparin (LOVENOX) injection 40 mg  40 mg Subcutaneous Q24H Charlett Nose, RPH      . famotidine (PEPCID) tablet 20 mg  20 mg Per Tube Daily Charlett Nose, RPH   20 mg at 09/06/18 1024  . fentaNYL 2555mg in NS 2561m(1064mml) infusion-PREMIX  0-400 mcg/hr Intravenous Continuous WilLance CoonD   Stopped at 09/06/18 1210  . insulin aspart (novoLOG) injection 0-9 Units  0-9 Units Subcutaneous Q4H KeeDarel Hong NP   2 Units at 09/03/18 2042  . ipratropium-albuterol (DUONEB) 0.5-2.5 (3) MG/3ML nebulizer solution 3 mL  3 mL Nebulization Q4H PRN KeeDarel Hong NP      . MEDLINE mouth rinse  15 mL Mouth Rinse 10 times per day KeeBradly BienenstockP   15 mL at 09/06/18 1407  . metoprolol tartrate (LOPRESSOR) injection 5 mg  5 mg Intravenous Q6H PRN GonTyler PitaD   5 mg at 09/06/18 1026  . phenylephrine (NEO-SYNEPHRINE) 10 mg in sodium chloride 0.9 % 250 mL (0.04 mg/mL) infusion  0-400 mcg/min Intravenous Titrated KalGladstone LighterD 30 mL/hr at 09/06/18 1400 20 mcg/min at  09/06/18 1400  . polyethylene glycol (MIRALAX / GLYCOLAX) packet 17 g  17 g Per NG tube Daily Tukov-Yual, Magdalene S, NP   17 g at 09/06/18 1024  . propofol (DIPRIVAN) 1000 MG/100ML infusion  5-80 mcg/kg/min Intravenous Titrated Lance Coon, MD   Stopped at 09/06/18 1207    VITAL SIGNS: BP (!) 116/59   Pulse 82   Temp 97.6 F (36.4 C) (Axillary)   Resp 17   Ht '5\' 2"'  (1.575 m)   Wt 125 lb 10.6 oz (57 kg)   SpO2 100%   BMI 22.98 kg/m  Filed Weights   08/11/2018 1753 09/03/18 0212 09/06/18  0500  Weight: 115 lb (52.2 kg) 121 lb 11.1 oz (55.2 kg) 125 lb 10.6 oz (57 kg)    Estimated body mass index is 22.98 kg/m as calculated from the following:   Height as of this encounter: '5\' 2"'  (1.575 m).   Weight as of this encounter: 125 lb 10.6 oz (57 kg).  LABS: CBC:    Component Value Date/Time   WBC 21.2 (H) 09/04/2018 0550   HGB 13.8 09/04/2018 0550   HCT 44.5 09/04/2018 0550   PLT 261 09/04/2018 0550   MCV 93.5 09/04/2018 0550   NEUTROABS 8.3 (H) 08/19/2018 1826   LYMPHSABS 0.9 09/05/2018 1826   MONOABS 0.6 08/17/2018 1826   EOSABS 0.1 09/04/2018 1826   BASOSABS 0.0 08/31/2018 1826   Comprehensive Metabolic Panel:    Component Value Date/Time   NA 138 09/06/2018 0424   K 3.9 09/06/2018 0424   CL 107 09/06/2018 0424   CO2 19 (L) 09/06/2018 0424   BUN 23 09/06/2018 0424   CREATININE 0.88 09/06/2018 0424   GLUCOSE 101 (H) 09/06/2018 0424   CALCIUM 8.4 (L) 09/06/2018 0424   AST 19 09/03/2018 2137   ALT 12 09/03/2018 2137   ALKPHOS 101 09/03/2018 2137   BILITOT 0.4 09/03/2018 2137   PROT 5.7 (L) 09/03/2018 2137   ALBUMIN 2.7 (L) 09/03/2018 2137    RADIOGRAPHIC STUDIES: Dg Abd 1 View  Result Date: 09/06/2018 CLINICAL DATA:  Ileus. EXAM: ABDOMEN - 1 VIEW COMPARISON:  09/07/2018 FINDINGS: An enteric tube remains in place with tip in the expected region of the proximal gastric body and side hole likely just below the GE junction. Gas is present in nondilated colon. No dilated bowel loops are seen to suggest obstruction. Surgical clips are present in the right upper abdomen and pelvis. Bilateral pleural effusions are again noted. There is mild thoracolumbar dextroscoliosis. IMPRESSION: Nonobstructed bowel gas pattern. Electronically Signed   By: Logan Bores M.D.   On: 09/06/2018 10:20   Ct Chest Wo Contrast  Result Date: 08/08/2018 CLINICAL DATA:  Dyspnea cough since Thanksgiving. EXAM: CT CHEST WITHOUT CONTRAST TECHNIQUE: Multidetector CT imaging of the chest was  performed following the standard protocol without IV contrast. COMPARISON:  CXR 08/26/2018 and 09/15/2010 FINDINGS: Cardiovascular: Cardiomegaly with aortic atherosclerosis is noted with left main and three-vessel coronary arteriosclerosis. No pericardial effusion or thickening. The thoracic aorta is nonaneurysmal. Unenhanced pulmonary vasculature is unremarkable. Mediastinum/Nodes: No thyromegaly or mass. Atherosclerosis of the great vessels. Small subcentimeter short axis mediastinal nodes are noted. Assessment for hilar adenopathy is limited due to lack of IV contrast. Small anterior chest wall hypodense subcutaneous nodule likely representing small sebaceous cyst is noted measuring up to 2.2 cm. Lungs/Pleura: Left greater than right moderate pleural effusions with adjacent compressive atelectasis. Respiratory motion artifacts limit assessment. Subtle nodular density in the left upper lobe measuring 4 mm,  series 3/45 is identified. Posterior segment right upper lobe pulmonary consolidation is visualized, series 3/59 with air bronchograms. Atelectasis or pneumonia might account for this appearance. No pneumothorax is seen. Upper Abdomen: No acute abnormality. Musculoskeletal: Thoracic spondylosis withage-indeterminate likely chronic mild inferior endplate compression of T8 and superior endplate of D35. IMPRESSION: 1. Moderate left greater than right pleural effusions with adjacent compressive atelectasis. 2. Posterior segment right upper lobe pulmonary consolidation with air bronchograms may represent atelectasis or pneumonia. 3. 4 mm nodular density in the left upper lobe. No follow-up needed if patient is low-risk. Non-contrast chest CT can be considered in 12 months if patient is high-risk. This recommendation follows the consensus statement: Guidelines for Management of Incidental Pulmonary Nodules Detected on CT Images: From the Fleischner Society 2017; Radiology 2017; 284:228-243. 4. Thoracic spondylosis.  Mild inferior endplate compression of T8 and superior endplate compression T01 likely remote. No retropulsion. Aortic Atherosclerosis (ICD10-I70.0). Electronically Signed   By: Ashley Royalty M.D.   On: 08/26/2018 20:09   Dg Chest Port 1 View  Result Date: 09/04/2018 CLINICAL DATA:  Acute respiratory failure EXAM: PORTABLE CHEST 1 VIEW COMPARISON:  Chest radiograph 09/03/2018 FINDINGS: ET tube terminates in the mid trachea. Enteric tube courses inferior to the diaphragm. Monitoring leads overlie the patient. Stable cardiac and mediastinal contours. Persistent layering bilateral pleural effusions with underlying opacities. No pneumothorax. IMPRESSION: Sport apparatus as above. Layering effusions with underlying opacities. Electronically Signed   By: Lovey Newcomer M.D.   On: 09/04/2018 08:00   Portable Chest Xray  Result Date: 09/03/2018 CLINICAL DATA:  Acute respiratory failure EXAM: PORTABLE CHEST 1 VIEW COMPARISON:  09/02/2017 FINDINGS: Endotracheal tube terminates 2.5 cm above the carina. No frank interstitial edema. Suspected small bilateral pleural effusions, right greater than left. No pneumothorax. Cardiomegaly. Degenerative changes of the thoracic spine. IMPRESSION: Endotracheal tube terminates 2.5 cm above the carina. Suspected small bilateral pleural effusions, right greater than left. Electronically Signed   By: Julian Hy M.D.   On: 09/03/2018 05:48   Dg Chest Portable 1 View  Result Date: 09/03/2018 CLINICAL DATA:  Respiratory failure. Status post intubation. EXAM: PORTABLE CHEST 1 VIEW COMPARISON:  Same day chest CT and CXR FINDINGS: New endotracheal tube tip is identified approximately 3.2 cm above the carina in satisfactory position. Right greater than left pleural effusions with atelectasis and interstitial edema is noted. Aortic atherosclerosis is identified. No acute osseous appearing abnormality. IMPRESSION: 1. Satisfactory endotracheal tube position. 2. Stable cardiomegaly and  aortic atherosclerosis. 3. Right greater than left pleural effusions with atelectasis and interstitial edema. Electronically Signed   By: Ashley Royalty M.D.   On: 08/11/2018 21:58   Dg Chest Portable 1 View  Result Date: 08/25/2018 CLINICAL DATA:  Hypoxia, shortness of Breath EXAM: PORTABLE CHEST 1 VIEW COMPARISON:  07/17/2017 FINDINGS: Cardiomegaly with vascular congestion. Moderate bilateral pleural effusions with bibasilar atelectasis or infiltrates. No acute bony abnormality. IMPRESSION: Cardiomegaly with vascular congestion. Moderate effusions with bibasilar atelectasis or pneumonia. Electronically Signed   By: Rolm Baptise M.D.   On: 08/10/2018 18:14   Dg Abd Portable 1 View  Result Date: 09/04/2018 CLINICAL DATA:  Check gastric catheter placement EXAM: PORTABLE ABDOMEN - 1 VIEW COMPARISON:  Chest x-ray from earlier in the same day. FINDINGS: Gastric catheter is noted within the stomach. Bilateral pleural effusions are again noted similar to that seen on recent chest x-ray. Degenerative changes of the lumbar spine is noted. IMPRESSION: Gastric catheter within the stomach. Bilateral pleural effusions. Electronically Signed  By: Inez Catalina M.D.   On: 08/25/2018 23:46    PERFORMANCE STATUS (ECOG) : 4 - Bedbound  Review of Systems As noted above. Otherwise, a complete review of systems is negative.  Physical Exam General: ill appearing Cardiovascular: Tachycardia  Pulmonary: unlabored on vent GU: Foley Extremities: no edema Skin: no rashes Neurological: Sedated  IMPRESSION: Weekend notes reviewed. Patient remains in CCU in ventilator. She is now on phenylephrine drip and amiodarone due to tachycardia.  Echo noted.  EF of 30%.  Cardiogenic shock is felt to be probable cause of respiratory failure.  I spoke with patient's son who is at bedside.  He was tearful as he explained losing his wife a few months ago and now with expectation that he will soon lose his mother.  He says that  family's primary goal is that she does not suffer.  He is hopeful that she will improve and be able to be extubated with no plan for reintubation.  He verbalized a desire to transition to comfort care following extubation if it becomes apparent that she will not tolerate.   Prognosis seems poor.   PLAN: Continue current scope of treatment Family likely to opt for one-way extubation in the coming days with transition to comfort care if patient fails Patient is a DNR   Time Total: 30 minutes  Visit consisted of counseling and education dealing with the complex and emotionally intense issues of symptom management and palliative care in the setting of serious and potentially life-threatening illness.Greater than 50%  of this time was spent counseling and coordinating care related to the above assessment and plan.  Signed by: Altha Harm, PhD, NP-C [phone] (Work Cell)

## 2018-09-06 NOTE — Progress Notes (Signed)
Patient's tube holder was changed.

## 2018-09-07 ENCOUNTER — Inpatient Hospital Stay: Payer: Medicare Other

## 2018-09-07 LAB — CBC WITH DIFFERENTIAL/PLATELET
Abs Immature Granulocytes: 0.05 10*3/uL (ref 0.00–0.07)
BASOS ABS: 0 10*3/uL (ref 0.0–0.1)
Basophils Relative: 0 %
Eosinophils Absolute: 0 10*3/uL (ref 0.0–0.5)
Eosinophils Relative: 0 %
HCT: 41 % (ref 36.0–46.0)
Hemoglobin: 13 g/dL (ref 12.0–15.0)
Immature Granulocytes: 1 %
Lymphocytes Relative: 7 %
Lymphs Abs: 0.7 10*3/uL (ref 0.7–4.0)
MCH: 29.1 pg (ref 26.0–34.0)
MCHC: 31.7 g/dL (ref 30.0–36.0)
MCV: 91.9 fL (ref 80.0–100.0)
Monocytes Absolute: 0.8 10*3/uL (ref 0.1–1.0)
Monocytes Relative: 7 %
Neutro Abs: 8.9 10*3/uL — ABNORMAL HIGH (ref 1.7–7.7)
Neutrophils Relative %: 85 %
Platelets: 206 10*3/uL (ref 150–400)
RBC: 4.46 MIL/uL (ref 3.87–5.11)
RDW: 13.9 % (ref 11.5–15.5)
WBC: 10.5 10*3/uL (ref 4.0–10.5)
nRBC: 0 % (ref 0.0–0.2)

## 2018-09-07 LAB — BASIC METABOLIC PANEL
Anion gap: 9 (ref 5–15)
BUN: 24 mg/dL — ABNORMAL HIGH (ref 8–23)
CO2: 22 mmol/L (ref 22–32)
CREATININE: 0.82 mg/dL (ref 0.44–1.00)
Calcium: 8.5 mg/dL — ABNORMAL LOW (ref 8.9–10.3)
Chloride: 107 mmol/L (ref 98–111)
GFR calc non Af Amer: >60 mL/min (ref >60)
Glucose, Bld: 191 mg/dL — ABNORMAL HIGH (ref 70–99)
Potassium: 4.2 mmol/L (ref 3.5–5.1)
Sodium: 138 mmol/L (ref 135–145)

## 2018-09-07 LAB — GLUCOSE, CAPILLARY
GLUCOSE-CAPILLARY: 208 mg/dL — AB (ref 70–99)
Glucose-Capillary: 161 mg/dL — ABNORMAL HIGH (ref 70–99)
Glucose-Capillary: 182 mg/dL — ABNORMAL HIGH (ref 70–99)
Glucose-Capillary: 194 mg/dL — ABNORMAL HIGH (ref 70–99)
Glucose-Capillary: 198 mg/dL — ABNORMAL HIGH (ref 70–99)
Glucose-Capillary: 285 mg/dL — ABNORMAL HIGH (ref 70–99)

## 2018-09-07 LAB — MAGNESIUM: Magnesium: 2.1 mg/dL (ref 1.7–2.4)

## 2018-09-07 MED ORDER — VITAL AF 1.2 CAL PO LIQD
1000.0000 mL | ORAL | Status: DC
  Administered 2018-09-07: 40 mL

## 2018-09-07 MED ORDER — VITAL HIGH PROTEIN PO LIQD: 1000.0000 mL | ORAL | Status: DC

## 2018-09-07 NOTE — Progress Notes (Signed)
Nutrition Follow-up  DOCUMENTATION CODES:   Non-severe (moderate) malnutrition in context of chronic illness  INTERVENTION:  Initiate Vital AF 1.2 at 40 mL/hr (960 mL goal daily volume). Provides 1152 kcal, 52 grams of protein, 778 mL H2O daily.  Provide liquid MVI daily per tube to meet 100% RDIs for vitamins/minerals.  Provide a minimum free water flush of 30 mL Q4hrs to maintain tube patency.  Monitor magnesium, potassium, and phosphorus daily for at least 3 days, MD to replete as needed, as pt is at risk for refeeding syndrome.  NUTRITION DIAGNOSIS:   Moderate Malnutrition related to chronic illness(dementia, advanced age) as evidenced by moderate fat depletion, mild muscle depletion, moderate muscle depletion.  Ongoing.  GOAL:   Provide needs based on ASPEN/SCCM guidelines  Not yet met - addressing with initiation of tube feeds today if patient is unable to extubate.  MONITOR:   Vent status, Labs, Weight trends, Skin, I & O's  REASON FOR ASSESSMENT:   Ventilator    ASSESSMENT:   84 year old female with PMHx of A-fib, DM, hx TIA, dementia admitted with acute hypoxic respiratory failure requiring intubation on 12/26 in setting of PNA, bilateral pleural effusions, and also with A-fib with RVR.  Patient remains intubated and sedated. She has been failing SBTs. On PRVC mode with FiO2 24% and PEEP 5 cmH2O. Abdomen remains soft. Patient has not had a BM yet this admission.  Enteral Access: 18 Fr. OGT placed 12/26; terminates in gastric body per abdominal x-ray 12/30; 51 cm at corner of mouth  MAP: 75-101 mmHg  Patient is currently intubated on ventilator support Ve: 7.4 L/min Temp (24hrs), Avg:98.3 F (36.8 C), Min:97.7 F (36.5 C), Max:98.8 F (37.1 C)  Propofol: N/A  Medications reviewed and include: famotidine 20 mg daily per tube, Novolog 0-9 units Q4hrs, Miralax 17 grams daily, amiodarone gtt, Precedex gtt, phenylephrine gtt now off.  Labs reviewed: CBG  161-208, BUN 24.  I/O: 350 mL UOP yesterday (0.3 mL/kg/hr)  Weight trend: 55.7 kg on 12/31; +0.5 kg from admission  Discussed with Attending/Hospitalist, RN, and on rounds. Plan is to initiate tube feeds today if patient is unable to extubate.  Diet Order:   Diet Order            Diet NPO time specified  Diet effective now             EDUCATION NEEDS:   No education needs have been identified at this time  Skin:  Skin Assessment: Skin Integrity Issues:(nonpressure wound to right leg)  Last BM:  Unknown/PTA  Height:   Ht Readings from Last 1 Encounters:  09/03/18 '5\' 2"'  (1.575 m)   Weight:   Wt Readings from Last 1 Encounters:  09/07/18 55.7 kg   Ideal Body Weight:  50 kg  BMI:  Body mass index is 22.46 kg/m.  Estimated Nutritional Needs:   Kcal:  1088 (PSU 2003b w/ MSJ 912, Ve 7.4, Tmax 37.1)  Protein:  65-85 grams (1.2-1.5 grams/kg)  Fluid:  1.4 L/day (25 mL/kg)  Willey Blade, MS, RD, LDN Office: [phone] Pager: [phone] After Hours/Weekend Pager: [phone]

## 2018-09-07 NOTE — Progress Notes (Signed)
[facility]  [phone] [phone]   Name: Dana Griffith Date: 09/07/2018 MRN: [id number]  DOB: [birth year]  Patient Care Team: Patient, No Pcp Per as PCP - General (General Practice) System, Pcp Not In    REASON FOR CONSULTATION: Palliative Care consult requested for this 84 y.o. female with multiple medical problems including mild dementia, afib not on anticoagulation, h/o TIA, h/o CHF, and DM. Patient was admitted to the hosptial on 08/09/2018 with VDRF secondary to PNA and afib with RVR. CT of the chest revealed RUL consolidation and B. Pleural effusions (R>L). Patient apparently had been treated in the outpatient setting for PNA but had clinically declined. Palliative care was consulted to help address goals.   CODE STATUS: DNR  PAST MEDICAL HISTORY: Past Medical History:  Diagnosis Date  . Atrial fibrillation ([place])   . Dementia ([place])   . Diabetes mellitus without complication ([place])   . Hyperlipemia   . SIADH (syndrome of inappropriate ADH production) ([place]) 2012  . TIA (transient ischemic attack)     PAST SURGICAL HISTORY:  Past Surgical History:  Procedure Laterality Date  . CATARACT EXTRACTION Bilateral   . CHOLECYSTECTOMY      HEMATOLOGY/ONCOLOGY HISTORY:   No history exists.    ALLERGIES:  is allergic to niaspan [niacin er].  MEDICATIONS:  Current Facility-Administered Medications  Medication Dose Route Frequency Provider Last Rate Last Dose  . 0.9 %  sodium chloride infusion   Intravenous PRN Tukov-Yual, Arlyss Gandy, NP   Stopped at 09/05/18 1733  . amiodarone (NEXTERONE PREMIX) 360-4.14 MG/200ML-% (1.8 mg/mL) IV infusion  30 mg/hr Intravenous Continuous Conforti, John, DO 16.67 mL/hr at 09/07/18 0830 30 mg/hr at 09/07/18 0830  . bisacodyl (DULCOLAX) suppository 10 mg  10 mg Rectal Daily PRN Tukov-Yual, Magdalene S, NP      . chlorhexidine gluconate (MEDLINE KIT) (PERIDEX) 0.12 % solution  15 mL  15 mL Mouth Rinse BID Darel Hong D, NP   15 mL at 09/07/18 0830  . dexmedetomidine (PRECEDEX) 400 MCG/100ML (4 mcg/mL) infusion  0.4-1.2 mcg/kg/hr Intravenous Titrated Conforti, John, DO   Stopped at 09/07/18 0830  . enoxaparin (LOVENOX) injection 40 mg  40 mg Subcutaneous Q24H Charlett Nose, RPH   40 mg at 09/06/18 2119  . famotidine (PEPCID) tablet 20 mg  20 mg Per Tube Daily Charlett Nose, RPH   20 mg at 09/07/18 1057  . insulin aspart (novoLOG) injection 0-9 Units  0-9 Units Subcutaneous Q4H Darel Hong D, NP   2 Units at 09/07/18 1058  . ipratropium-albuterol (DUONEB) 0.5-2.5 (3) MG/3ML nebulizer solution 3 mL  3 mL Nebulization Q4H PRN Darel Hong D, NP      . MEDLINE mouth rinse  15 mL Mouth Rinse 10 times per day Bradly Bienenstock, NP   15 mL at 09/07/18 1059  . metoprolol tartrate (LOPRESSOR) injection 5 mg  5 mg Intravenous Q6H PRN Tyler Pita, MD   5 mg at 09/06/18 1026  . phenylephrine (NEO-SYNEPHRINE) 10 mg in sodium chloride 0.9 % 250 mL (0.04 mg/mL) infusion  0-400 mcg/min Intravenous Titrated Gladstone Lighter, MD   Stopped at 09/07/18 4076  . polyethylene glycol (MIRALAX / GLYCOLAX) packet 17 g  17 g Per NG tube Daily Tukov-Yual, Magdalene S, NP   17 g at 09/07/18 1057    VITAL SIGNS: BP 114/76   Pulse 85   Temp 97.7 F (36.5 C) (Oral)   Resp (!) 25  Ht '5\' 2"'  (1.575 m)   Wt 122 lb 12.7 oz (55.7 kg)   SpO2 98%   BMI 22.46 kg/m  Filed Weights   09/03/18 0212 09/06/18 0500 09/07/18 0500  Weight: 121 lb 11.1 oz (55.2 kg) 125 lb 10.6 oz (57 kg) 122 lb 12.7 oz (55.7 kg)    Estimated body mass index is 22.46 kg/m as calculated from the following:   Height as of this encounter: '5\' 2"'  (1.575 m).   Weight as of this encounter: 122 lb 12.7 oz (55.7 kg).  LABS: CBC:    Component Value Date/Time   WBC 10.5 09/07/2018 0341   HGB 13.0 09/07/2018 0341   HCT 41.0 09/07/2018 0341   PLT 206 09/07/2018 0341   MCV 91.9 09/07/2018 0341    NEUTROABS 8.9 (H) 09/07/2018 0341   LYMPHSABS 0.7 09/07/2018 0341   MONOABS 0.8 09/07/2018 0341   EOSABS 0.0 09/07/2018 0341   BASOSABS 0.0 09/07/2018 0341   Comprehensive Metabolic Panel:    Component Value Date/Time   NA 138 09/07/2018 0341   K 4.2 09/07/2018 0341   CL 107 09/07/2018 0341   CO2 22 09/07/2018 0341   BUN 24 (H) 09/07/2018 0341   CREATININE 0.82 09/07/2018 0341   GLUCOSE 191 (H) 09/07/2018 0341   CALCIUM 8.5 (L) 09/07/2018 0341   AST 19 09/03/2018 2137   ALT 12 09/03/2018 2137   ALKPHOS 101 09/03/2018 2137   BILITOT 0.4 09/03/2018 2137   PROT 5.7 (L) 09/03/2018 2137   ALBUMIN 2.7 (L) 09/03/2018 2137    RADIOGRAPHIC STUDIES: Dg Abd 1 View  Result Date: 09/06/2018 CLINICAL DATA:  Ileus. EXAM: ABDOMEN - 1 VIEW COMPARISON:  09/01/2018 FINDINGS: An enteric tube remains in place with tip in the expected region of the proximal gastric body and side hole likely just below the GE junction. Gas is present in nondilated colon. No dilated bowel loops are seen to suggest obstruction. Surgical clips are present in the right upper abdomen and pelvis. Bilateral pleural effusions are again noted. There is mild thoracolumbar dextroscoliosis. IMPRESSION: Nonobstructed bowel gas pattern. Electronically Signed   By: Logan Bores M.D.   On: 09/06/2018 10:20   Ct Chest Wo Contrast  Result Date: 09/04/2018 CLINICAL DATA:  Dyspnea cough since Thanksgiving. EXAM: CT CHEST WITHOUT CONTRAST TECHNIQUE: Multidetector CT imaging of the chest was performed following the standard protocol without IV contrast. COMPARISON:  CXR 08/18/2018 and 09/15/2010 FINDINGS: Cardiovascular: Cardiomegaly with aortic atherosclerosis is noted with left main and three-vessel coronary arteriosclerosis. No pericardial effusion or thickening. The thoracic aorta is nonaneurysmal. Unenhanced pulmonary vasculature is unremarkable. Mediastinum/Nodes: No thyromegaly or mass. Atherosclerosis of the great vessels. Small  subcentimeter short axis mediastinal nodes are noted. Assessment for hilar adenopathy is limited due to lack of IV contrast. Small anterior chest wall hypodense subcutaneous nodule likely representing small sebaceous cyst is noted measuring up to 2.2 cm. Lungs/Pleura: Left greater than right moderate pleural effusions with adjacent compressive atelectasis. Respiratory motion artifacts limit assessment. Subtle nodular density in the left upper lobe measuring 4 mm, series 3/45 is identified. Posterior segment right upper lobe pulmonary consolidation is visualized, series 3/59 with air bronchograms. Atelectasis or pneumonia might account for this appearance. No pneumothorax is seen. Upper Abdomen: No acute abnormality. Musculoskeletal: Thoracic spondylosis withage-indeterminate likely chronic mild inferior endplate compression of T8 and superior endplate of Z61. IMPRESSION: 1. Moderate left greater than right pleural effusions with adjacent compressive atelectasis. 2. Posterior segment right upper lobe pulmonary consolidation with air bronchograms  may represent atelectasis or pneumonia. 3. 4 mm nodular density in the left upper lobe. No follow-up needed if patient is low-risk. Non-contrast chest CT can be considered in 12 months if patient is high-risk. This recommendation follows the consensus statement: Guidelines for Management of Incidental Pulmonary Nodules Detected on CT Images: From the Fleischner Society 2017; Radiology 2017; 284:228-243. 4. Thoracic spondylosis. Mild inferior endplate compression of T8 and superior endplate compression E99 likely remote. No retropulsion. Aortic Atherosclerosis (ICD10-I70.0). Electronically Signed   By: Ashley Royalty M.D.   On: 08/29/2018 20:09   Dg Chest Port 1 View  Result Date: 09/07/2018 CLINICAL DATA:  Acute respiratory failure with hypoxia, ventilated patient. History of atrial fibrillation and previous TIA. EXAM: PORTABLE CHEST 1 VIEW COMPARISON:  Portable chest x-ray  of September 04, 2018 FINDINGS: The lungs are well-expanded. There are persistent bibasilar densities consistent with posterior layering pleural fluid collections. There is persistent bibasilar density greatest on the left. The heart is top-normal in size. The pulmonary vascularity is not clearly engorged. There is calcification in the wall of the aortic arch. The endotracheal tube tip projects 2.5 cm above the carina. The esophagogastric tube tip and proximal port project below the GE junction. IMPRESSION: Persistent bilateral pleural effusions layering posteriorly. Bibasilar atelectasis or infiltrates greatest on the left. No overt pulmonary edema. The support tubes are in reasonable position. Thoracic aortic atherosclerosis. Electronically Signed   By: David  Martinique M.D.   On: 09/07/2018 09:04   Dg Chest Port 1 View  Result Date: 09/04/2018 CLINICAL DATA:  Acute respiratory failure EXAM: PORTABLE CHEST 1 VIEW COMPARISON:  Chest radiograph 09/03/2018 FINDINGS: ET tube terminates in the mid trachea. Enteric tube courses inferior to the diaphragm. Monitoring leads overlie the patient. Stable cardiac and mediastinal contours. Persistent layering bilateral pleural effusions with underlying opacities. No pneumothorax. IMPRESSION: Sport apparatus as above. Layering effusions with underlying opacities. Electronically Signed   By: Lovey Newcomer M.D.   On: 09/04/2018 08:00   Portable Chest Xray  Result Date: 09/03/2018 CLINICAL DATA:  Acute respiratory failure EXAM: PORTABLE CHEST 1 VIEW COMPARISON:  09/02/2017 FINDINGS: Endotracheal tube terminates 2.5 cm above the carina. No frank interstitial edema. Suspected small bilateral pleural effusions, right greater than left. No pneumothorax. Cardiomegaly. Degenerative changes of the thoracic spine. IMPRESSION: Endotracheal tube terminates 2.5 cm above the carina. Suspected small bilateral pleural effusions, right greater than left. Electronically Signed   By: Julian Hy M.D.   On: 09/03/2018 05:48   Dg Chest Portable 1 View  Result Date: 08/26/2018 CLINICAL DATA:  Respiratory failure. Status post intubation. EXAM: PORTABLE CHEST 1 VIEW COMPARISON:  Same day chest CT and CXR FINDINGS: New endotracheal tube tip is identified approximately 3.2 cm above the carina in satisfactory position. Right greater than left pleural effusions with atelectasis and interstitial edema is noted. Aortic atherosclerosis is identified. No acute osseous appearing abnormality. IMPRESSION: 1. Satisfactory endotracheal tube position. 2. Stable cardiomegaly and aortic atherosclerosis. 3. Right greater than left pleural effusions with atelectasis and interstitial edema. Electronically Signed   By: Ashley Royalty M.D.   On: 08/10/2018 21:58   Dg Chest Portable 1 View  Result Date: 09/04/2018 CLINICAL DATA:  Hypoxia, shortness of Breath EXAM: PORTABLE CHEST 1 VIEW COMPARISON:  07/17/2017 FINDINGS: Cardiomegaly with vascular congestion. Moderate bilateral pleural effusions with bibasilar atelectasis or infiltrates. No acute bony abnormality. IMPRESSION: Cardiomegaly with vascular congestion. Moderate effusions with bibasilar atelectasis or pneumonia. Electronically Signed   By: Rolm Baptise M.D.  On: 08/08/2018 18:14   Dg Abd Portable 1 View  Result Date: 08/25/2018 CLINICAL DATA:  Check gastric catheter placement EXAM: PORTABLE ABDOMEN - 1 VIEW COMPARISON:  Chest x-ray from earlier in the same day. FINDINGS: Gastric catheter is noted within the stomach. Bilateral pleural effusions are again noted similar to that seen on recent chest x-ray. Degenerative changes of the lumbar spine is noted. IMPRESSION: Gastric catheter within the stomach. Bilateral pleural effusions. Electronically Signed   By: Inez Catalina M.D.   On: 08/21/2018 23:46    PERFORMANCE STATUS (ECOG) : 4 - Bedbound  Review of Systems As noted above. Otherwise, a complete review of systems is negative.  Physical  Exam General: ill appearing Cardiovascular: regular Pulmonary: unlabored on vent GU: Foley Extremities: no edema Skin: no rashes Neurological: Sedated  IMPRESSION: Patient off sedation and pressors.   I spoke with daughter who is at bedside. Family are hopeful that patient will tolerate weaning from the vent. SBT planned for today per nurse.   Will follow.   PLAN: Continue current scope of treatment Vent weaning DNR  Time Total: 15 minutes  Visit consisted of counseling and education dealing with the complex and emotionally intense issues of symptom management and palliative care in the setting of serious and potentially life-threatening illness.Greater than 50%  of this time was spent counseling and coordinating care related to the above assessment and plan.  Signed by: Altha Harm, PhD, NP-C [phone] (Work Cell)

## 2018-09-07 NOTE — Progress Notes (Signed)
Pharmacy Electrolyte Monitoring Consult:  Pharmacy consulted to assist in monitoring and replacing electrolytes in this 84 y.o. female admitted on 08/25/2018 with Shortness of Breath  Patient is currently intubated and sedated with dexmedetomidine infusion.   Labs:  Sodium (mmol/L)  Date Value  09/07/2018 138   Potassium (mmol/L)  Date Value  09/07/2018 4.2   Magnesium (mg/dL)  Date Value  11/91/478212/31/2019 2.1   Phosphorus (mg/dL)  Date Value  95/62/130812/29/2019 3.6   Calcium (mg/dL)  Date Value  65/78/469612/31/2019 8.5 (L)   Albumin (g/dL)  Date Value  29/52/841312/27/2019 2.7 (L)    Assessment/Plan: Patient initiated on Vital AF at 540mL/hr. Will need to check BMP/Magnesium/Phosphorus x 3 days.   Will replace for goal potassium ~ 4, goal magnesium ~ 2, and goal phosphorus > 2.5.   Pharmacy will continue to monitor and adjust per consult.   Laureen Frederic L 09/07/2018 3:17 PM

## 2018-09-07 NOTE — Progress Notes (Signed)
   09/07/18 1005  Clinical Encounter Type  Visited With Patient not available  Visit Type Follow-up  Spiritual Encounters  Spiritual Needs Prayer   Attempted follow up with patient and family; patient and daughter both sleeping.  Chaplain offered silent and energetic prayers.

## 2018-09-07 NOTE — Progress Notes (Signed)
[facility] [place] [facility]   PATIENT NAME: Dana SlipperMargaret Griffith    MR#:  [id number]  DATE OF BIRTH:  November 05, 1921  SUBJECTIVE:  CHIEF COMPLAINT:   Chief Complaint  Patient presents with  . Shortness of Breath   - on minimal sedation this morning, opening eyes to name.  Improved vitals.  On amiodarone drip for tachycardia  REVIEW OF SYSTEMS:  Review of Systems  Unable to perform ROS: Critical illness    DRUG ALLERGIES:   Allergies  Allergen Reactions  . Niaspan [Niacin Er] Other (See Comments)    Severe flushing, redness of face as if sunburned    VITALS:  Blood pressure 139/86, pulse 85, temperature 97.7 F (36.5 C), temperature source Oral, resp. rate (!) 29, height 5\' 2"  (1.575 m), weight 55.7 kg, SpO2 98 %.  PHYSICAL EXAMINATION:  Physical Exam  GENERAL:  84 y.o.-year-old elderly patient lying in the bed, critically ill-appearing  EYES: Pupils equal, round, reactive to light and accommodation. No scleral icterus. Extraocular muscles intact.  HEENT: Head atraumatic, normocephalic. Oropharynx and nasopharynx clear.  NECK:  Supple, no jugular venous distention. No thyroid enlargement, no tenderness.  LUNGS: Normal breath sounds bilaterally, no wheezing, rales,rhonchi or crepitation. No use of accessory muscles of respiration.  Decreased bibasilar breath sounds CARDIOVASCULAR: S1, S2 normal. No  rubs, or gallops.  3/6 systolic murmur is present ABDOMEN: Soft, nontender, nondistended.  Hypoactive bowel sounds present. No organomegaly or mass.  EXTREMITIES: No pedal edema, cyanosis, or clubbing.  NEUROLOGIC: Currently sedated-when sedation decreased, she is trying to open her eyes.  Not following commands.  Withdrawing to pain PSYCHIATRIC: The patient is intubated and sedated SKIN: No obvious rash, lesion, or ulcer.    LABORATORY PANEL:   CBC Recent Labs  Lab 09/07/18 0341  WBC 10.5  HGB 13.0  HCT 41.0  PLT 206    ------------------------------------------------------------------------------------------------------------------  Chemistries  Recent Labs  Lab 09/03/18 2137  09/07/18 0341  NA 137   < > 138  K 4.2   < > 4.2  CL 106   < > 107  CO2 24   < > 22  GLUCOSE 161*   < > 191*  BUN 23   < > 24*  CREATININE 0.91   < > 0.82  CALCIUM 8.6*   < > 8.5*  MG 1.9   < > 2.1  AST 19  --   --   ALT 12  --   --   ALKPHOS 101  --   --   BILITOT 0.4  --   --    < > = values in this interval not displayed.   ------------------------------------------------------------------------------------------------------------------  Cardiac Enzymes Recent Labs  Lab 09/05/18 0408  TROPONINI 0.06*   ------------------------------------------------------------------------------------------------------------------  RADIOLOGY:  Dg Abd 1 View  Result Date: 09/06/2018 CLINICAL DATA:  Ileus. EXAM: ABDOMEN - 1 VIEW COMPARISON:  18-Jun-2018 FINDINGS: An enteric tube remains in place with tip in the expected region of the proximal gastric body and side hole likely just below the GE junction. Gas is present in nondilated colon. No dilated bowel loops are seen to suggest obstruction. Surgical clips are present in the right upper abdomen and pelvis. Bilateral pleural effusions are again noted. There is mild thoracolumbar dextroscoliosis. IMPRESSION: Nonobstructed bowel gas pattern. Electronically Signed   By: Sebastian AcheAllen  Grady M.D.   On: 09/06/2018 10:20   Dg Chest Port 1 View  Result Date: 09/07/2018 CLINICAL DATA:  Acute respiratory failure with hypoxia, ventilated  patient. History of atrial fibrillation and previous TIA. EXAM: PORTABLE CHEST 1 VIEW COMPARISON:  Portable chest x-ray of September 04, 2018 FINDINGS: The lungs are well-expanded. There are persistent bibasilar densities consistent with posterior layering pleural fluid collections. There is persistent bibasilar density greatest on the left. The heart is  top-normal in size. The pulmonary vascularity is not clearly engorged. There is calcification in the wall of the aortic arch. The endotracheal tube tip projects 2.5 cm above the carina. The esophagogastric tube tip and proximal port project below the GE junction. IMPRESSION: Persistent bilateral pleural effusions layering posteriorly. Bibasilar atelectasis or infiltrates greatest on the left. No overt pulmonary edema. The support tubes are in reasonable position. Thoracic aortic atherosclerosis. Electronically Signed   By: David  SwazilandJordan M.D.   On: 09/07/2018 09:04    EKG:   Orders placed or performed during the hospital encounter of 04-01-18  . ED EKG  . ED EKG  . EKG 12-Lead  . EKG 12-Lead  . EKG 12-Lead  . EKG 12-Lead    ASSESSMENT AND PLAN:   84 year old female with past medical history significant for A. fib not on anticoagulation, mild dementia, history of CHF unknown ejection fraction, diabetes and TIA who lives at home with her daughter was brought in secondary to difficulty breathing  1.  Acute hypoxic respiratory failure-intubated and currently on ventilator -Likely cause is cardiogenic shock. -Management per intensivist -Respiratory failure secondary CHF exacerbation -Was started on antibiotics for presumed pneumonia.  However since procalcitonin is negative and blood cultures are negative, Rocephin has been discontinued. -PRN Lasix as needed since blood pressure is low. -Echocardiogram is with EF significantly reduced from last year -Needing pressors now-try to wean off today.  Concern for cardiogenic shock,   cardiomyopathy -continue to  wean sedation and off the vent as tolerated -Patient is a DNR.  Plan is to continue current management and wean off the ventilator -Discussed about starting nutrition today  2.  CHF exacerbation-echocardiogram as outpatient from last year showing diastolic dysfunction with EF of 60 to 65%, echocardiogram this admission showing a drop in EF  to 30% with multiple wall motion abnormalities and changes in valvular function as well. -Currently on the ventilator -On   pressors.    3.  Diabetes mellitus-on sliding scale insulin.    4.  Mild dementia-according to son, still able to recognize family has mild dementia.  Ambulates with minimal assistance at baseline.  5.  DVT prophylaxis-Lovenox.  6.  Sinus tachycardia-with some PVCs in between-continue to monitor.  Remains on phenylephrine drip.   Also on amiodarone drip  Appreciate Palliative care consult.   -CODE STATUS changed to DNR. Updated daughter today    All the records are reviewed and case discussed with Care Management/Social Workerr. Management plans discussed with the patient, family and they are in agreement.  CODE STATUS: DNR  TOTAL TIME TAKING CARE OF THIS PATIENT: 28 minutes.   POSSIBLE D/C IN ? DAYS, DEPENDING ON CLINICAL CONDITION.   Enid Baasadhika Truth Wolaver M.D on 09/07/2018 at 12:10 PM  Between 7am to 6pm - Pager - [phone]  After 6pm go to [email or web address] - password [email or web address]  [facility]  Office  [phone]  CC: Primary care physician; Patient, No Pcp Per

## 2018-09-07 NOTE — Progress Notes (Signed)
Follow up - Critical Care Medicine Note  Patient Details:    Dana Griffith is an 84 y.o. female.admitted with Acute Hypoxic Respiratory failure requiring intubation in the ED in the setting of bilateral pleural effusions, and  A-fib with RVR.  Patient noted on echocardiogram to have ejection fraction reduced at 30 to 35%.  diastolic dysfunction and global hypokinesia with pulmonary hypertension.  Lines, Airways, Drains: Airway 7.5 mm (Active)  Secured at (cm) 22 cm 09/06/2018  3:45 AM  Measured From Lips 09/06/2018  3:45 AM  Secured Location Center 09/06/2018  3:45 AM  Secured By [employer] 09/06/2018  3:45 AM  Tube Holder Repositioned Yes 09/06/2018  3:13 AM  Cuff Pressure (cm H2O) 24 cm H2O 09/05/2018  7:13 PM  Site Condition Dry 09/06/2018  3:45 AM     NG/OG Tube Orogastric 18 Fr. Center mouth Xray;Aucultation Documented cm marking at nare/ corner of mouth 51 cm (Active)  Cm Marking at Nare/Corner of Mouth (if applicable) 51 cm 09/06/2018  3:45 AM  Site Assessment Clean;Dry;Intact 09/06/2018  3:45 AM  Ongoing Placement Verification No change in cm markings or external length of tube from initial placement 09/06/2018  3:45 AM  Status Suction-low intermittent 09/06/2018  3:45 AM  Amount of suction 80 mmHg 09/06/2018  3:45 AM  Drainage Appearance Other (Comment) 09/06/2018  3:45 AM  Intake (mL) 60 mL 09/05/2018 11:27 AM  Output (mL) 5 mL 09/03/2018  6:00 PM     Urethral Catheter [name], RN Double-lumen 14 Fr. (Active)  Indication for Insertion or Continuance of Catheter Unstable critical patients (first 24-48 hours) 09/06/2018  3:45 AM  Site Assessment Clean;Intact 09/06/2018  3:45 AM  Catheter Maintenance Bag below level of bladder;Catheter secured;Drainage bag/tubing not touching floor;Insertion date on drainage bag;No dependent loops;Seal intact 09/06/2018  3:45 AM  Collection Container Standard drainage bag 09/06/2018  3:45 AM  Securement Method Securing device  (Describe) 09/06/2018  3:45 AM  Urinary Catheter Interventions Unclamped 09/06/2018  3:45 AM  Input (mL) 150 mL 09/03/2018  4:25 AM  Output (mL) 75 mL 09/06/2018  6:28 AM    Anti-infectives:  Anti-infectives (From admission, onward)   Start     Dose/Rate Route Frequency Ordered Stop   09/04/18 0200  vancomycin (VANCOCIN) IVPB 1000 mg/200 mL premix  Status:  Discontinued     1,000 mg 200 mL/hr over 60 Minutes Intravenous Every 36 hours 09/03/18 0436 09/03/18 1022   09/03/18 1800  cefTRIAXone (ROCEPHIN) 1 g in sodium chloride 0.9 % 100 mL IVPB  Status:  Discontinued     1 g 200 mL/hr over 30 Minutes Intravenous Every 24 hours 09/03/18 1022 09/05/18 1951   09/03/18 0245  ceFEPIme (MAXIPIME) 2 g in sodium chloride 0.9 % 100 mL IVPB  Status:  Discontinued     2 g 200 mL/hr over 30 Minutes Intravenous Every 24 hours 09/03/18 0241 09/03/18 1022   09/03/18 0245  vancomycin (VANCOCIN) IVPB 1000 mg/200 mL premix     1,000 mg 200 mL/hr over 60 Minutes Intravenous  Once 09/03/18 0241 09/03/18 0530   08/26/2018 1900  cefTRIAXone (ROCEPHIN) 1 g in sodium chloride 0.9 % 100 mL IVPB     1 g 200 mL/hr over 30 Minutes Intravenous  Once 08/23/2018 1855 09/06/2018 2013   08/09/2018 1900  azithromycin (ZITHROMAX) 500 mg in sodium chloride 0.9 % 250 mL IVPB     500 mg 250 mL/hr over 60 Minutes Intravenous  Once 09/06/2018 1855 09/07/2018 2134  Microbiology: Results for orders placed or performed during the hospital encounter of 08/13/2018  MRSA PCR Screening     Status: None   Collection Time: 09/03/18  1:59 AM  Result Value Ref Range Status   MRSA by PCR NEGATIVE NEGATIVE Final    Comment:        The GeneXpert MRSA Assay (FDA approved for NASAL specimens only), is one component of a comprehensive MRSA colonization surveillance program. It is not intended to diagnose MRSA infection nor to guide or monitor treatment for MRSA infections. Performed at [facility], [address]  [place], [place] [address]   Culture, respiratory (non-expectorated)     Status: None   Collection Time: 09/03/18  8:58 AM  Result Value Ref Range Status   Specimen Description   Final    TRACHEAL ASPIRATE Performed at [facility], [address] [place], [place] [address]    Special Requests   Final    Normal Performed at [facility], [address] [place], [place] [address]    Gram Stain   Final    MODERATE WBC PRESENT, PREDOMINANTLY PMN RARE SQUAMOUS EPITHELIAL CELLS PRESENT RARE GRAM POSITIVE COCCI    Culture   Final    FEW Consistent with normal respiratory flora. Performed at [facility], [address], [place], [place] [address]    Report Status 09/05/2018 FINAL  Final    Studies: Dg Abd 1 View  Result Date: 09/06/2018 CLINICAL DATA:  Ileus. EXAM: ABDOMEN - 1 VIEW COMPARISON:  08/20/2018 FINDINGS: An enteric tube remains in place with tip in the expected region of the proximal gastric body and side hole likely just below the GE junction. Gas is present in nondilated colon. No dilated bowel loops are seen to suggest obstruction. Surgical clips are present in the right upper abdomen and pelvis. Bilateral pleural effusions are again noted. There is mild thoracolumbar dextroscoliosis. IMPRESSION: Nonobstructed bowel gas pattern. Electronically Signed   By: Sebastian Ache M.D.   On: 09/06/2018 10:20   Ct Chest Wo Contrast  Result Date: 08/09/2018 CLINICAL DATA:  Dyspnea cough since Thanksgiving. EXAM: CT CHEST WITHOUT CONTRAST TECHNIQUE: Multidetector CT imaging of the chest was performed following the standard protocol without IV contrast. COMPARISON:  CXR 09/07/2018 and 09/15/2010 FINDINGS: Cardiovascular: Cardiomegaly with aortic atherosclerosis is noted with left main and three-vessel coronary arteriosclerosis. No pericardial effusion or thickening. The thoracic aorta is nonaneurysmal. Unenhanced pulmonary vasculature is unremarkable.  Mediastinum/Nodes: No thyromegaly or mass. Atherosclerosis of the great vessels. Small subcentimeter short axis mediastinal nodes are noted. Assessment for hilar adenopathy is limited due to lack of IV contrast. Small anterior chest wall hypodense subcutaneous nodule likely representing small sebaceous cyst is noted measuring up to 2.2 cm. Lungs/Pleura: Left greater than right moderate pleural effusions with adjacent compressive atelectasis. Respiratory motion artifacts limit assessment. Subtle nodular density in the left upper lobe measuring 4 mm, series 3/45 is identified. Posterior segment right upper lobe pulmonary consolidation is visualized, series 3/59 with air bronchograms. Atelectasis or pneumonia might account for this appearance. No pneumothorax is seen. Upper Abdomen: No acute abnormality. Musculoskeletal: Thoracic spondylosis withage-indeterminate likely chronic mild inferior endplate compression of T8 and superior endplate of T10. IMPRESSION: 1. Moderate left greater than right pleural effusions with adjacent compressive atelectasis. 2. Posterior segment right upper lobe pulmonary consolidation with air bronchograms may represent atelectasis or pneumonia. 3. 4 mm nodular density in the left upper lobe. No follow-up needed if patient is low-risk. Non-contrast  chest CT can be considered in 12 months if patient is high-risk. This recommendation follows the consensus statement: Guidelines for Management of Incidental Pulmonary Nodules Detected on CT Images: From the Fleischner Society 2017; Radiology 2017; 284:228-243. 4. Thoracic spondylosis. Mild inferior endplate compression of T8 and superior endplate compression T10 likely remote. No retropulsion. Aortic Atherosclerosis (ICD10-I70.0). Electronically Signed   By: Tollie Ethavid  Kwon M.D.   On: 08/11/2018 20:09   Dg Chest Port 1 View  Result Date: 09/04/2018 CLINICAL DATA:  Acute respiratory failure EXAM: PORTABLE CHEST 1 VIEW COMPARISON:  Chest radiograph  09/03/2018 FINDINGS: ET tube terminates in the mid trachea. Enteric tube courses inferior to the diaphragm. Monitoring leads overlie the patient. Stable cardiac and mediastinal contours. Persistent layering bilateral pleural effusions with underlying opacities. No pneumothorax. IMPRESSION: Sport apparatus as above. Layering effusions with underlying opacities. Electronically Signed   By: Annia Beltrew  Davis M.D.   On: 09/04/2018 08:00   Portable Chest Xray  Result Date: 09/03/2018 CLINICAL DATA:  Acute respiratory failure EXAM: PORTABLE CHEST 1 VIEW COMPARISON:  09/02/2017 FINDINGS: Endotracheal tube terminates 2.5 cm above the carina. No frank interstitial edema. Suspected small bilateral pleural effusions, right greater than left. No pneumothorax. Cardiomegaly. Degenerative changes of the thoracic spine. IMPRESSION: Endotracheal tube terminates 2.5 cm above the carina. Suspected small bilateral pleural effusions, right greater than left. Electronically Signed   By: Charline BillsSriyesh  Krishnan M.D.   On: 09/03/2018 05:48   Dg Chest Portable 1 View  Result Date: 08/22/2018 CLINICAL DATA:  Respiratory failure. Status post intubation. EXAM: PORTABLE CHEST 1 VIEW COMPARISON:  Same day chest CT and CXR FINDINGS: New endotracheal tube tip is identified approximately 3.2 cm above the carina in satisfactory position. Right greater than left pleural effusions with atelectasis and interstitial edema is noted. Aortic atherosclerosis is identified. No acute osseous appearing abnormality. IMPRESSION: 1. Satisfactory endotracheal tube position. 2. Stable cardiomegaly and aortic atherosclerosis. 3. Right greater than left pleural effusions with atelectasis and interstitial edema. Electronically Signed   By: Tollie Ethavid  Kwon M.D.   On: 08/11/2018 21:58   Dg Chest Portable 1 View  Result Date: 08/29/2018 CLINICAL DATA:  Hypoxia, shortness of Breath EXAM: PORTABLE CHEST 1 VIEW COMPARISON:  07/17/2017 FINDINGS: Cardiomegaly with vascular  congestion. Moderate bilateral pleural effusions with bibasilar atelectasis or infiltrates. No acute bony abnormality. IMPRESSION: Cardiomegaly with vascular congestion. Moderate effusions with bibasilar atelectasis or pneumonia. Electronically Signed   By: Charlett NoseKevin  Dover M.D.   On: 09/06/2018 18:14   Dg Abd Portable 1 View  Result Date: 08/14/2018 CLINICAL DATA:  Check gastric catheter placement EXAM: PORTABLE ABDOMEN - 1 VIEW COMPARISON:  Chest x-ray from earlier in the same day. FINDINGS: Gastric catheter is noted within the stomach. Bilateral pleural effusions are again noted similar to that seen on recent chest x-ray. Degenerative changes of the lumbar spine is noted. IMPRESSION: Gastric catheter within the stomach. Bilateral pleural effusions. Electronically Signed   By: Alcide CleverMark  Lukens M.D.   On: 08/09/2018 23:46    Subjective:    Overnight Issues: Patient remains on mechanical ventilation, started on amiodarone for rapid atrial fibrillation, is also on Precedex  Objective:  Vital signs for last 24 hours: Temp:  [97.6 F (36.4 C)-98.8 F (37.1 C)] 98.8 F (37.1 C) (12/31 0400) Pulse Rate:  [33-153] 85 (12/31 0500) Resp:  [13-21] 19 (12/31 0600) BP: (78-156)/(49-95) 100/66 (12/31 0600) SpO2:  [94 %-100 %] 100 % (12/31 0500) FiO2 (%):  [35 %] 35 % (12/31 0311) Weight:  [55.7 kg]  55.7 kg (12/31 0500)  Hemodynamic parameters for last 24 hours:    Intake/Output from previous day: 12/30 0701 - 12/31 0700 In: 849.9 [I.V.:679.9; NG/GT:120; IV Piggyback:50] Out: 350 [Urine:350]  Intake/Output this shift: No intake/output data recorded.  Vent settings for last 24 hours: Vent Mode: PRVC FiO2 (%):  [35 %] 35 % Set Rate:  [16 bmp] 16 bmp Vt Set:  [400 mL] 400 mL PEEP:  [5 cmH20] 5 cmH20 Pressure Support:  [18 cmH20] 18 cmH20 Plateau Pressure:  [16 cmH20-20 cmH20] 16 cmH20  Physical Exam:  General: Frail, chronically ill appearing female, laying in bed, intubated, sedated,  thin. Neuro:Sedated, withdraws from pain, Pupils PERRL 3 mm sluggish bilaterally.  When sedation lightened she will get agitated. HEENT:Atraumatic, normocephalic, neck supple, JVD, ETT in place Cardiovascular:Irregulary irregular rhythm, rate controlled, grade 2/6 systolic ejection murmur at the left sternal border. Lungs: Rales at the bases with diminished breath sounds at the bases  Abdomen:Soft, nontender, nondistended, no grimacing when palpated no guarding, BS+ x4 Musculoskeletal:No deformities, normal bulk and tone, 2+ edema bilateral LE Skin:Warm/dry. No obvious rashes, lesions, or ulcerations  Assessment/Plan:  Acute hypoxic respiratory failure, ventilator dependent caused by decompensation of systolic heart failure and A. fib with RVR: Continue ventilator support.  Discussions with family yesterday.  They would like to give her a chance at successful extubation.  They do not wish her reintubated if she deteriorates and to move more towards comfort care.  If unable to meet weaning parameters will have a discussion regarding one-way extubation and comfort care.  Cardiomyopathy likely ischemic: She has had decompensation, we were able to obtain copy of echocardiogram performed in 2018 in [place].  The patient at that time had findings consistent with significant diastolic dysfunction (grade III), mild pulmonary hypertension and ejection fraction of 60 to 65%.  Currently her ejection fraction is 30 to 35%.  She also has significant valvular disease both aortic stenosis and mitral regurgitation.   Atrial fibrillation with RVR:  Patient has had varying ventricular response control.  Improved on amiodarone.  Neo-Synephrine has been weaned off  Bilateral pleural effusions: These are due to decompensated heart failure, she has been difficult to diurese due to labile blood pressures.    Diabetes mellitus with hyperglycemia: Continue to monitor CBGs, she is on sliding scale  insulin, continue ICU hypo-/hyperglycemic protocol.  Agitation.  Patient is resting comfortably on Precedex   Critical Care Total Time 40 minutes  Dezman Granda 09/07/2018  *Care during the described time interval was provided by me and/or other providers on the critical care team.  I have reviewed this patient's available data, including medical history, events of note, physical examination and test results as part of my evaluation. Patient ID: Dana Griffith, female   DOB: 08/24/1922, 84 y.o.   MRN: [id number]

## 2018-09-07 NOTE — Care Management Note (Signed)
Case Management Note  Patient Details  Name: Dana Griffith MRN: [id number] Date of Birth: May 30, 1922  Subjective/Objective:    Patient admitted with acute hypoxic respiratory failure requiring intubation.  Attempts to wean the ventilator have been unsuccessful.  Family is at the bedside granddaughter and her father, the patient's son.  Patient is from home living with her daughter [name] before she became ill.  Patient needed assistance with ADL's such as feeding and getting up to go to the restroom.  Granddaughter reports that the family understands the prognosis is poor and the patient probably will not be able to leave the hospital.  The family at this time would like for the patient to be able to be extubated and then let nature take it's course.  Granddaughter reports they just want the patient to be comfortable.  Palliative care consulted.   Robbie LisJeanna Klee Kolek RN BSN [phone]                  Action/Plan:   Expected Discharge Date:                  Expected Discharge Plan:     In-House Referral:     Discharge planning Services     Post Acute Care Choice:    Choice offered to:     DME Arranged:    DME Agency:     HH Arranged:    HH Agency:     Status of Service:  In process, will continue to follow  If discussed at Long Length of Stay Meetings, dates discussed:    Additional Comments:  Allayne ButcherJeanna M Yohannes Waibel, RN 09/07/2018, 2:06 PM

## 2018-09-07 NOTE — Progress Notes (Signed)
   09/07/18 1905  Clinical Encounter Type  Visited With Patient and family together  Visit Type Follow-up  Spiritual Encounters  Spiritual Needs Emotional   Chaplain followed up with family at patient's bedside, engaged in life review through sharing of videos and music which holds meaning for patient.  Chaplain encouraged family to reach out as needed.

## 2018-09-08 DIAGNOSIS — Z7189 Other specified counseling: Secondary | ICD-10-CM

## 2018-09-08 DIAGNOSIS — Z9911 Dependence on respirator [ventilator] status: Secondary | ICD-10-CM

## 2018-09-08 LAB — CBC
HCT: 40.3 % (ref 36.0–46.0)
Hemoglobin: 12.5 g/dL (ref 12.0–15.0)
MCH: 28.7 pg (ref 26.0–34.0)
MCHC: 31 g/dL (ref 30.0–36.0)
MCV: 92.6 fL (ref 80.0–100.0)
Platelets: 170 10*3/uL (ref 150–400)
RBC: 4.35 MIL/uL (ref 3.87–5.11)
RDW: 14.1 % (ref 11.5–15.5)
WBC: 12.9 10*3/uL — AB (ref 4.0–10.5)
nRBC: 0 % (ref 0.0–0.2)

## 2018-09-08 LAB — GLUCOSE, CAPILLARY
GLUCOSE-CAPILLARY: 261 mg/dL — AB (ref 70–99)
Glucose-Capillary: 364 mg/dL — ABNORMAL HIGH (ref 70–99)
Glucose-Capillary: 191 mg/dL — ABNORMAL HIGH (ref 70–99)
Glucose-Capillary: 242 mg/dL — ABNORMAL HIGH (ref 70–99)

## 2018-09-08 LAB — BASIC METABOLIC PANEL
Anion gap: 10 (ref 5–15)
BUN: 37 mg/dL — ABNORMAL HIGH (ref 8–23)
CALCIUM: 8.6 mg/dL — AB (ref 8.9–10.3)
CO2: 21 mmol/L — ABNORMAL LOW (ref 22–32)
Chloride: 106 mmol/L (ref 98–111)
Creatinine, Ser: 0.98 mg/dL (ref 0.44–1.00)
GFR calc Af Amer: 57 mL/min — ABNORMAL LOW (ref >60)
GFR calc non Af Amer: 49 mL/min — ABNORMAL LOW (ref >60)
Glucose, Bld: 290 mg/dL — ABNORMAL HIGH (ref 70–99)
Potassium: 4 mmol/L (ref 3.5–5.1)
SODIUM: 137 mmol/L (ref 135–145)

## 2018-09-08 LAB — MAGNESIUM: Magnesium: 2.1 mg/dL (ref 1.7–2.4)

## 2018-09-08 LAB — PHOSPHORUS: Phosphorus: 2.8 mg/dL (ref 2.5–4.6)

## 2018-09-08 MED ORDER — AMIODARONE HCL IN DEXTROSE 360-4.14 MG/200ML-% IV SOLN
60.0000 mg/h | INTRAVENOUS | Status: AC
  Administered 2018-09-09: 60 mg/h via INTRAVENOUS
  Filled 2018-09-08: qty 200

## 2018-09-08 MED ORDER — LORAZEPAM 2 MG/ML IJ SOLN: 0.2500 mg | Freq: Four times a day (QID) | INTRAMUSCULAR | Status: DC | PRN

## 2018-09-08 MED ORDER — ENOXAPARIN SODIUM 30 MG/0.3ML ~~LOC~~ SOLN
30.0000 mg | SUBCUTANEOUS | Status: DC
  Administered 2018-09-09: 30 mg via SUBCUTANEOUS
  Filled 2018-09-08: qty 0.3

## 2018-09-08 MED ORDER — GLYCOPYRROLATE 0.2 MG/ML IJ SOLN
0.2000 mg | INTRAMUSCULAR | Status: DC | PRN
  Administered 2018-09-08: 0.2 mg via INTRAVENOUS
  Filled 2018-09-08: qty 1

## 2018-09-08 MED ORDER — AMIODARONE IV BOLUS ONLY 150 MG/100ML
INTRAVENOUS | Status: AC
  Administered 2018-09-08: 150 mg
  Filled 2018-09-08: qty 100

## 2018-09-08 MED ORDER — INSULIN ASPART 100 UNIT/ML ~~LOC~~ SOLN
0.0000 [IU] | SUBCUTANEOUS | Status: DC
  Administered 2018-09-08: 20 [IU] via SUBCUTANEOUS
  Administered 2018-09-08: 11 [IU] via SUBCUTANEOUS
  Administered 2018-09-08: 4 [IU] via SUBCUTANEOUS
  Filled 2018-09-08 (×3): qty 1

## 2018-09-08 MED ORDER — AMIODARONE LOAD VIA INFUSION
150.0000 mg | Freq: Once | INTRAVENOUS | Status: DC
  Filled 2018-09-08: qty 83.34

## 2018-09-08 MED ORDER — MORPHINE 100MG IN NS 100ML (1MG/ML) PREMIX INFUSION: 10.0000 mg/h | INTRAVENOUS | Status: DC

## 2018-09-08 MED ORDER — MORPHINE SULFATE (PF) 2 MG/ML IV SOLN
2.0000 mg | INTRAVENOUS | Status: DC | PRN
  Administered 2018-09-08 – 2018-09-09 (×2): 2 mg via INTRAVENOUS
  Filled 2018-09-08 (×2): qty 1

## 2018-09-08 MED ORDER — AMIODARONE HCL IN DEXTROSE 360-4.14 MG/200ML-% IV SOLN
INTRAVENOUS | Status: AC
  Administered 2018-09-08: 60 mg/h via INTRAVENOUS
  Filled 2018-09-08: qty 200

## 2018-09-08 MED ORDER — ENOXAPARIN SODIUM 30 MG/0.3ML ~~LOC~~ SOLN: 30.0000 mg | SUBCUTANEOUS | Status: DC

## 2018-09-08 MED ORDER — AMIODARONE HCL IN DEXTROSE 360-4.14 MG/200ML-% IV SOLN
30.0000 mg/h | INTRAVENOUS | Status: DC
  Filled 2018-09-08: qty 200

## 2018-09-08 MED ORDER — METOPROLOL TARTRATE 5 MG/5ML IV SOLN
5.0000 mg | Freq: Four times a day (QID) | INTRAVENOUS | Status: DC | PRN
  Administered 2018-09-08 – 2018-09-09 (×2): 5 mg via INTRAVENOUS
  Filled 2018-09-08 (×2): qty 5

## 2018-09-08 MED ORDER — MORPHINE SULFATE (PF) 2 MG/ML IV SOLN
1.0000 mg | INTRAVENOUS | Status: DC | PRN
  Administered 2018-09-08 (×2): 2 mg via INTRAVENOUS
  Filled 2018-09-08 (×2): qty 1

## 2018-09-08 MED ORDER — LEVALBUTEROL HCL 0.63 MG/3ML IN NEBU
0.6300 mg | INHALATION_SOLUTION | RESPIRATORY_TRACT | Status: DC | PRN
  Administered 2018-09-08: 0.63 mg via RESPIRATORY_TRACT
  Filled 2018-09-08: qty 3

## 2018-09-08 MED ORDER — INSULIN ASPART 100 UNIT/ML ~~LOC~~ SOLN
0.0000 [IU] | SUBCUTANEOUS | Status: DC
  Administered 2018-09-08 – 2018-09-09 (×2): 5 [IU] via SUBCUTANEOUS
  Administered 2018-09-09: 2 [IU] via SUBCUTANEOUS
  Filled 2018-09-08 (×3): qty 1

## 2018-09-08 MED ORDER — LORAZEPAM 2 MG/ML IJ SOLN
1.0000 mg | INTRAMUSCULAR | Status: DC | PRN
  Administered 2018-09-08: 1 mg via INTRAVENOUS
  Filled 2018-09-08: qty 1

## 2018-09-08 MED ORDER — LORAZEPAM 2 MG/ML IJ SOLN: 0.5000 mg | Freq: Four times a day (QID) | INTRAMUSCULAR | Status: DC | PRN

## 2018-09-08 NOTE — Progress Notes (Addendum)
Pharmacy Electrolyte Monitoring Consult:  Pharmacy consulted to assist in monitoring and replacing electrolytes in this 85 y.o. female admitted on 09/07/2018 with Shortness of Breath  Patient is currently intubated and sedated with dexmedetomidine infusion.   Labs:  Sodium (mmol/L)  Date Value  09/08/2018 137   Potassium (mmol/L)  Date Value  09/08/2018 4.0   Magnesium (mg/dL)  Date Value  16/10/960401/09/2018 2.1   Phosphorus (mg/dL)  Date Value  54/09/811901/09/2018 2.8   Calcium (mg/dL)  Date Value  14/78/295601/09/2018 8.6 (L)   Albumin (g/dL)  Date Value  21/30/865712/27/2019 2.7 (L)    Assessment/Plan: Patient initiated on Vital AF at 5040mL/hr. Will need to check BMP/Magnesium/Phosphorus x 3 days.   Will replace for goal potassium ~ 4, goal magnesium ~ 2, and goal phosphorus > 2.5.  Electrolytes WNL.  Will recheck electrolytes with AM labs tomorrow morning.  Pharmacy will continue to monitor and adjust per consult.   Orinda Kennerhris A Allyse Fregeau, PharmD Clinical Pharmacist 09/08/2018 7:16 AM

## 2018-09-08 NOTE — Progress Notes (Signed)
Follow up - Critical Care Medicine Note  Patient Details:    Dana Griffith is an 85 y.o. female.admitted with Acute Hypoxic Respiratory failure requiring intubation in the ED in the setting of bilateral pleural effusions, and  A-fib with RVR.  Patient noted on echocardiogram to have ejection fraction reduced at 30 to 35%.  diastolic dysfunction and global hypokinesia with pulmonary hypertension.  Lines, Airways, Drains: Airway 7.5 mm (Active)  Secured at (cm) 22 cm 09/06/2018  3:45 AM  Measured From Lips 09/06/2018  3:45 AM  Secured Location Center 09/06/2018  3:45 AM  Secured By [employer] 09/06/2018  3:45 AM  Tube Holder Repositioned Yes 09/06/2018  3:13 AM  Cuff Pressure (cm H2O) 24 cm H2O 09/05/2018  7:13 PM  Site Condition Dry 09/06/2018  3:45 AM     NG/OG Tube Orogastric 18 Fr. Center mouth Xray;Aucultation Documented cm marking at nare/ corner of mouth 51 cm (Active)  Cm Marking at Nare/Corner of Mouth (if applicable) 51 cm 09/06/2018  3:45 AM  Site Assessment Clean;Dry;Intact 09/06/2018  3:45 AM  Ongoing Placement Verification No change in cm markings or external length of tube from initial placement 09/06/2018  3:45 AM  Status Suction-low intermittent 09/06/2018  3:45 AM  Amount of suction 80 mmHg 09/06/2018  3:45 AM  Drainage Appearance Other (Comment) 09/06/2018  3:45 AM  Intake (mL) 60 mL 09/05/2018 11:27 AM  Output (mL) 5 mL 09/03/2018  6:00 PM     Urethral Catheter [name], RN Double-lumen 14 Fr. (Active)  Indication for Insertion or Continuance of Catheter Unstable critical patients (first 24-48 hours) 09/06/2018  3:45 AM  Site Assessment Clean;Intact 09/06/2018  3:45 AM  Catheter Maintenance Bag below level of bladder;Catheter secured;Drainage bag/tubing not touching floor;Insertion date on drainage bag;No dependent loops;Seal intact 09/06/2018  3:45 AM  Collection Container Standard drainage bag 09/06/2018  3:45 AM  Securement Method Securing device  (Describe) 09/06/2018  3:45 AM  Urinary Catheter Interventions Unclamped 09/06/2018  3:45 AM  Input (mL) 150 mL 09/03/2018  4:25 AM  Output (mL) 75 mL 09/06/2018  6:28 AM    Anti-infectives:  Anti-infectives (From admission, onward)   Start     Dose/Rate Route Frequency Ordered Stop   09/04/18 0200  vancomycin (VANCOCIN) IVPB 1000 mg/200 mL premix  Status:  Discontinued     1,000 mg 200 mL/hr over 60 Minutes Intravenous Every 36 hours 09/03/18 0436 09/03/18 1022   09/03/18 1800  cefTRIAXone (ROCEPHIN) 1 g in sodium chloride 0.9 % 100 mL IVPB  Status:  Discontinued     1 g 200 mL/hr over 30 Minutes Intravenous Every 24 hours 09/03/18 1022 09/05/18 1951   09/03/18 0245  ceFEPIme (MAXIPIME) 2 g in sodium chloride 0.9 % 100 mL IVPB  Status:  Discontinued     2 g 200 mL/hr over 30 Minutes Intravenous Every 24 hours 09/03/18 0241 09/03/18 1022   09/03/18 0245  vancomycin (VANCOCIN) IVPB 1000 mg/200 mL premix     1,000 mg 200 mL/hr over 60 Minutes Intravenous  Once 09/03/18 0241 09/03/18 0530   08/26/2018 1900  cefTRIAXone (ROCEPHIN) 1 g in sodium chloride 0.9 % 100 mL IVPB     1 g 200 mL/hr over 30 Minutes Intravenous  Once 08/23/2018 1855 09/06/2018 2013   08/09/2018 1900  azithromycin (ZITHROMAX) 500 mg in sodium chloride 0.9 % 250 mL IVPB     500 mg 250 mL/hr over 60 Minutes Intravenous  Once 09/06/2018 1855 09/07/2018 2134  Microbiology: Results for orders placed or performed during the hospital encounter of 08/13/2018  MRSA PCR Screening     Status: None   Collection Time: 09/03/18  1:59 AM  Result Value Ref Range Status   MRSA by PCR NEGATIVE NEGATIVE Final    Comment:        The GeneXpert MRSA Assay (FDA approved for NASAL specimens only), is one component of a comprehensive MRSA colonization surveillance program. It is not intended to diagnose MRSA infection nor to guide or monitor treatment for MRSA infections. Performed at [facility], [address]  [place], [place] [address]   Culture, respiratory (non-expectorated)     Status: None   Collection Time: 09/03/18  8:58 AM  Result Value Ref Range Status   Specimen Description   Final    TRACHEAL ASPIRATE Performed at [facility], [address] [place], [place] [address]    Special Requests   Final    Normal Performed at [facility], [address] [place], [place] [address]    Gram Stain   Final    MODERATE WBC PRESENT, PREDOMINANTLY PMN RARE SQUAMOUS EPITHELIAL CELLS PRESENT RARE GRAM POSITIVE COCCI    Culture   Final    FEW Consistent with normal respiratory flora. Performed at [facility], [address], [place], [place] [address]    Report Status 09/05/2018 FINAL  Final    Studies: Dg Abd 1 View  Result Date: 09/06/2018 CLINICAL DATA:  Ileus. EXAM: ABDOMEN - 1 VIEW COMPARISON:  08/20/2018 FINDINGS: An enteric tube remains in place with tip in the expected region of the proximal gastric body and side hole likely just below the GE junction. Gas is present in nondilated colon. No dilated bowel loops are seen to suggest obstruction. Surgical clips are present in the right upper abdomen and pelvis. Bilateral pleural effusions are again noted. There is mild thoracolumbar dextroscoliosis. IMPRESSION: Nonobstructed bowel gas pattern. Electronically Signed   By: Sebastian Ache M.D.   On: 09/06/2018 10:20   Ct Chest Wo Contrast  Result Date: 08/09/2018 CLINICAL DATA:  Dyspnea cough since Thanksgiving. EXAM: CT CHEST WITHOUT CONTRAST TECHNIQUE: Multidetector CT imaging of the chest was performed following the standard protocol without IV contrast. COMPARISON:  CXR 09/07/2018 and 09/15/2010 FINDINGS: Cardiovascular: Cardiomegaly with aortic atherosclerosis is noted with left main and three-vessel coronary arteriosclerosis. No pericardial effusion or thickening. The thoracic aorta is nonaneurysmal. Unenhanced pulmonary vasculature is unremarkable.  Mediastinum/Nodes: No thyromegaly or mass. Atherosclerosis of the great vessels. Small subcentimeter short axis mediastinal nodes are noted. Assessment for hilar adenopathy is limited due to lack of IV contrast. Small anterior chest wall hypodense subcutaneous nodule likely representing small sebaceous cyst is noted measuring up to 2.2 cm. Lungs/Pleura: Left greater than right moderate pleural effusions with adjacent compressive atelectasis. Respiratory motion artifacts limit assessment. Subtle nodular density in the left upper lobe measuring 4 mm, series 3/45 is identified. Posterior segment right upper lobe pulmonary consolidation is visualized, series 3/59 with air bronchograms. Atelectasis or pneumonia might account for this appearance. No pneumothorax is seen. Upper Abdomen: No acute abnormality. Musculoskeletal: Thoracic spondylosis withage-indeterminate likely chronic mild inferior endplate compression of T8 and superior endplate of T10. IMPRESSION: 1. Moderate left greater than right pleural effusions with adjacent compressive atelectasis. 2. Posterior segment right upper lobe pulmonary consolidation with air bronchograms may represent atelectasis or pneumonia. 3. 4 mm nodular density in the left upper lobe. No follow-up needed if patient is low-risk. Non-contrast  chest CT can be considered in 12 months if patient is high-risk. This recommendation follows the consensus statement: Guidelines for Management of Incidental Pulmonary Nodules Detected on CT Images: From the Fleischner Society 2017; Radiology 2017; 284:228-243. 4. Thoracic spondylosis. Mild inferior endplate compression of T8 and superior endplate compression T10 likely remote. No retropulsion. Aortic Atherosclerosis (ICD10-I70.0). Electronically Signed   By: Tollie Ethavid  Kwon M.D.   On: 09/01/2018 20:09   Dg Chest Port 1 View  Result Date: 09/07/2018 CLINICAL DATA:  Acute respiratory failure with hypoxia, ventilated patient. History of atrial  fibrillation and previous TIA. EXAM: PORTABLE CHEST 1 VIEW COMPARISON:  Portable chest x-ray of September 04, 2018 FINDINGS: The lungs are well-expanded. There are persistent bibasilar densities consistent with posterior layering pleural fluid collections. There is persistent bibasilar density greatest on the left. The heart is top-normal in size. The pulmonary vascularity is not clearly engorged. There is calcification in the wall of the aortic arch. The endotracheal tube tip projects 2.5 cm above the carina. The esophagogastric tube tip and proximal port project below the GE junction. IMPRESSION: Persistent bilateral pleural effusions layering posteriorly. Bibasilar atelectasis or infiltrates greatest on the left. No overt pulmonary edema. The support tubes are in reasonable position. Thoracic aortic atherosclerosis. Electronically Signed   By: David  SwazilandJordan M.D.   On: 09/07/2018 09:04   Dg Chest Port 1 View  Result Date: 09/04/2018 CLINICAL DATA:  Acute respiratory failure EXAM: PORTABLE CHEST 1 VIEW COMPARISON:  Chest radiograph 09/03/2018 FINDINGS: ET tube terminates in the mid trachea. Enteric tube courses inferior to the diaphragm. Monitoring leads overlie the patient. Stable cardiac and mediastinal contours. Persistent layering bilateral pleural effusions with underlying opacities. No pneumothorax. IMPRESSION: Sport apparatus as above. Layering effusions with underlying opacities. Electronically Signed   By: Annia Beltrew  Davis M.D.   On: 09/04/2018 08:00   Portable Chest Xray  Result Date: 09/03/2018 CLINICAL DATA:  Acute respiratory failure EXAM: PORTABLE CHEST 1 VIEW COMPARISON:  09/02/2017 FINDINGS: Endotracheal tube terminates 2.5 cm above the carina. No frank interstitial edema. Suspected small bilateral pleural effusions, right greater than left. No pneumothorax. Cardiomegaly. Degenerative changes of the thoracic spine. IMPRESSION: Endotracheal tube terminates 2.5 cm above the carina. Suspected small  bilateral pleural effusions, right greater than left. Electronically Signed   By: Charline BillsSriyesh  Krishnan M.D.   On: 09/03/2018 05:48   Dg Chest Portable 1 View  Result Date: 08/19/2018 CLINICAL DATA:  Respiratory failure. Status post intubation. EXAM: PORTABLE CHEST 1 VIEW COMPARISON:  Same day chest CT and CXR FINDINGS: New endotracheal tube tip is identified approximately 3.2 cm above the carina in satisfactory position. Right greater than left pleural effusions with atelectasis and interstitial edema is noted. Aortic atherosclerosis is identified. No acute osseous appearing abnormality. IMPRESSION: 1. Satisfactory endotracheal tube position. 2. Stable cardiomegaly and aortic atherosclerosis. 3. Right greater than left pleural effusions with atelectasis and interstitial edema. Electronically Signed   By: Tollie Ethavid  Kwon M.D.   On: 08/28/2018 21:58   Dg Chest Portable 1 View  Result Date: 08/10/2018 CLINICAL DATA:  Hypoxia, shortness of Breath EXAM: PORTABLE CHEST 1 VIEW COMPARISON:  07/17/2017 FINDINGS: Cardiomegaly with vascular congestion. Moderate bilateral pleural effusions with bibasilar atelectasis or infiltrates. No acute bony abnormality. IMPRESSION: Cardiomegaly with vascular congestion. Moderate effusions with bibasilar atelectasis or pneumonia. Electronically Signed   By: Charlett NoseKevin  Dover M.D.   On: 08/15/2018 18:14   Dg Abd Portable 1 View  Result Date: 08/11/2018 CLINICAL DATA:  Check gastric catheter placement EXAM: PORTABLE  ABDOMEN - 1 VIEW COMPARISON:  Chest x-ray from earlier in the same day. FINDINGS: Gastric catheter is noted within the stomach. Bilateral pleural effusions are again noted similar to that seen on recent chest x-ray. Degenerative changes of the lumbar spine is noted. IMPRESSION: Gastric catheter within the stomach. Bilateral pleural effusions. Electronically Signed   By: Alcide Clever M.D.   On: 2018-09-09 23:46    Subjective:    Overnight Issues: Patient remains on  mechanical ventilation, on amiodarone and Precedex  Objective:  Vital signs for last 24 hours: Temp:  [97.5 F (36.4 C)-98.2 F (36.8 C)] 98.2 F (36.8 C) (01/01 0400) Pulse Rate:  [41-79] 62 (01/01 0600) Resp:  [16-31] 31 (01/01 0600) BP: (86-147)/(50-107) 97/68 (01/01 0600) SpO2:  [98 %-100 %] 100 % (01/01 0600) FiO2 (%):  [24 %] 24 % (01/01 0252) Weight:  [56.1 kg] 56.1 kg (01/01 0402)  Hemodynamic parameters for last 24 hours:    Intake/Output from previous day: 12/31 0701 - 01/01 0700 In: 1129.4 [I.V.:686.1; NG/GT:443.3] Out: 521 [Urine:520; Stool:1]  Intake/Output this shift: No intake/output data recorded.  Vent settings for last 24 hours: Vent Mode: PRVC FiO2 (%):  [24 %] 24 % Set Rate:  [16 bmp] 16 bmp Vt Set:  [400 mL] 400 mL PEEP:  [5 cmH20] 5 cmH20 Pressure Support:  [8 cmH20-12 cmH20] 8 cmH20 Plateau Pressure:  [15 cmH20-18 cmH20] 18 cmH20  Physical Exam:  General: Frail, chronically ill appearing female, laying in bed, intubated, sedated, thin. Neuro:Sedated, withdraws from pain, Pupils PERRL 3 mm sluggish bilaterally.  When sedation lightened she will get agitated. HEENT:Atraumatic, normocephalic, neck supple, JVD, ETT in place Cardiovascular:Irregulary irregular rhythm, rate controlled, grade 2/6 systolic ejection murmur at the left sternal border. Lungs: Rales at the bases with diminished breath sounds at the bases  Abdomen:Soft, nontender, nondistended, no grimacing when palpated no guarding, BS+ x4 Musculoskeletal:No deformities, normal bulk and tone, 2+ edema bilateral LE Skin:Warm/dry. No obvious rashes, lesions, or ulcerations  Assessment/Plan:  Acute hypoxic respiratory failure, ventilator dependent caused by decompensation of systolic heart failure and A. fib with RVR: We will perform breathing trial this morning, discussed with family will consider extubation today  Cardiomyopathy likely ischemic: She has had decompensation, we  were able to obtain copy of echocardiogram performed in [place] in [place].  The patient at that time had findings consistent with significant diastolic dysfunction (grade III), mild pulmonary hypertension and ejection fraction of 60 to 65%.  Currently her ejection fraction is 30 to 35%.  She also has significant valvular disease both aortic stenosis and mitral regurgitation.   Atrial fibrillation with RVR:  Patient has had varying ventricular response control.  Improved on amiodarone.   Bilateral pleural effusions: These are due to decompensated heart failure, she has been difficult to diurese due to labile blood pressures.    Diabetes mellitus with hyperglycemia: Continue to monitor CBGs, she is on sliding scale insulin, continue ICU hypo-/hyperglycemic protocol.  Agitation.  Patient is resting comfortably on Precedex  Prerenal azotemia.  BUN is 37/creatinine 0.98  Leukocytosis.  White count is 12.9.  No clinical evidence of infection at this time   Critical Care Total Time 30 minutes  Quindell Shere 09/08/2018  *Care during the described time interval was provided by me and/or other providers on the critical care team.  I have reviewed this patient's available data, including medical history, events of note, physical examination and test results as part of my evaluation. Patient ID: Julieanne Manson, female  DOB: [birth year], 85 y.o.   MRN: [id number] Patient ID: Julieanne MansonMargaret H Tri, female   DOB: [birth year], 85 y.o.   MRN: [id number]

## 2018-09-08 NOTE — Progress Notes (Signed)
Patient ID: Dana Griffith, female   DOB: 06-21-1922, 85 y.o.   MRN: [id number] Pulmonary / Critical Care Attending  Family discussion. Reviewed persistant respiratory failure despite maximal medical therapy. They wish further family to visit and then one way extubation. If patient deteriorates transition to comfort care measures. Will have morphine and as needed ativan available in case patients status deteriorates.  Tora Kindred, DO

## 2018-09-08 NOTE — Progress Notes (Signed)
   09/08/18 1800  Clinical Encounter Type  Visited With Patient and family together  Visit Type Follow-up  Referral From Nurse  Spiritual Encounters  Spiritual Needs Emotional  [place] paged to come and offer support to family; [place] met with family at bedside and let family know that [facility] is here and available to support as requested or needed.

## 2018-09-08 NOTE — Progress Notes (Signed)
Pt's family is in the process of transitioning to comfort care, they are waiting for the arrival of additional family members before they liberate pt from the ventilator. At this time, family has requested that no frivolous treatment or procedures take place (such as CBG checks and mouth care) and to keep pt as comfortable as possible while still on the vent.

## 2018-09-08 NOTE — Progress Notes (Signed)
CH paged to return and offered prayer for family and patient;

## 2018-09-08 NOTE — Progress Notes (Signed)
Comfort care,extubated per MD order

## 2018-09-08 NOTE — Progress Notes (Addendum)
Palliative:  HPI: 85 yo female with PMH significant for atrial fibrillation, HLD, TIA, diabetes, dementia admitted 09/03/18 with SOB and was intubated. EF 30-35% on echo 09/04/18 and decline felt likely s/t cardiogenic shock. She has had minimal improvement on vent and family facing decision for one way extubation.   I met this morning with Ms. Serratore's daughter at bedside. She confirms goal for extubation today and we discussed the likelihood of decline after extubation with pain and shortness of breath and utilizing medications to assist with symptoms to ensure Ms. Hagberg's comfort. Daughter explains that her mother has had a good life and has been a wonderful mother. Emotional support provided. PRN medications put in place to assist with comfort. Daughter awaiting other family members to arrive prior to pursuing extubation.   Late entry: I was called by RN to come back to bedside. Daughter, son, and two granddaughtersare at bedside. There appears to be some confusion and miscommunication. Granddaughter is very upset and I listen as she explains her frustration. Sounds like they were hopeful yesterday with weaning trial and they are struggling that today it appears less hopeful that Ms. Wentling will be able to extubate successfully. They are continuing to try and process bad news and their grief. After listening and validating family frustrations they do all agree they wish to proceed with one way extubation when family arrives. They continue to hold out some hope that she will improve but are able to acknowledge that she is likely not to leave the hospital or even this hospital room. They also all agree that she should not suffer. I did explain signs of suffering and they agree to continue with medications for comfort now and upon extubation. Therapeutic listening and emotional support provided.   Exam: Minimally responsive, does not follow commands. Eyes open to family voices at times but only briefly and  does not track. Tachycardia, atrial fib. Increased resp rate with mod distress on vent. Feet cool to touch.   Plan: - One way extubation when family ready and all family members arrive to bedside. They do wish to proceed with this likely today.  - PRN morphine, lorazepam, glycopyrrolate to ensure comfort.  - She does take Xanax at home and I recommend small dose of morphine and lorazepam prior to extubation and family is okay with this.   45 min  Vinie Sill, NP Palliative Medicine Team Pager # [phone] (M-F 8a-5p) Team Phone # [phone] (Nights/Weekends)

## 2018-09-08 NOTE — Progress Notes (Addendum)
[facility] [place] [facility]   PATIENT NAME: Dana Griffith    MR#:  [id number]  DATE OF BIRTH:  October 30, 1921  SUBJECTIVE:  Case discussed with intensivist, status post extubation on yesterday, prognosis remains quite poor, palliative care following  REVIEW OF SYSTEMS:  Review of Systems  Unable to perform ROS: Critical illness    DRUG ALLERGIES:   Allergies  Allergen Reactions  . Niaspan [Niacin Er] Other (See Comments)    Severe flushing, redness of face as if sunburned    VITALS:  Blood pressure (!) 132/91, pulse 76, temperature 98.6 F (37 C), temperature source Axillary, resp. rate (!) 25, height 5\' 2"  (1.575 m), weight 56.1 kg, SpO2 100 %.  PHYSICAL EXAMINATION:  Physical Exam  GENERAL:  85 y.o.-year-old elderly patient lying in the bed, critically ill-appearing  EYES: Pupils equal, round, reactive to light and accommodation. No scleral icterus. Extraocular muscles intact.  HEENT: Head atraumatic, normocephalic. Oropharynx and nasopharynx clear.  NECK:  Supple, no jugular venous distention. No thyroid enlargement, no tenderness.  LUNGS: Normal breath sounds bilaterally, no wheezing, rales,rhonchi or crepitation. No use of accessory muscles of respiration.  Decreased bibasilar breath sounds CARDIOVASCULAR: S1, S2 normal. No  rubs, or gallops.  3/6 systolic murmur is present ABDOMEN: Soft, nontender, nondistended.  Hypoactive bowel sounds present. No organomegaly or mass.  EXTREMITIES: No pedal edema, cyanosis, or clubbing.  NEUROLOGIC: Currently sedated-when sedation decreased, she is trying to open her eyes.  Not following commands.  Withdrawing to pain PSYCHIATRIC: The patient is intubated and sedated SKIN: No obvious rash, lesion, or ulcer.    LABORATORY PANEL:   CBC Recent Labs  Lab 09/08/18 0525  WBC 12.9*  HGB 12.5  HCT 40.3  PLT 170    ------------------------------------------------------------------------------------------------------------------  Chemistries  Recent Labs  Lab 09/03/18 2137  09/08/18 0525  NA 137   < > 137  K 4.2   < > 4.0  CL 106   < > 106  CO2 24   < > 21*  GLUCOSE 161*   < > 290*  BUN 23   < > 37*  CREATININE 0.91   < > 0.98  CALCIUM 8.6*   < > 8.6*  MG 1.9   < > 2.1  AST 19  --   --   ALT 12  --   --   ALKPHOS 101  --   --   BILITOT 0.4  --   --    < > = values in this interval not displayed.   ------------------------------------------------------------------------------------------------------------------  Cardiac Enzymes Recent Labs  Lab 09/05/18 0408  TROPONINI 0.06*   ------------------------------------------------------------------------------------------------------------------  RADIOLOGY:  Dg Chest Port 1 View  Result Date: 09/07/2018 CLINICAL DATA:  Acute respiratory failure with hypoxia, ventilated patient. History of atrial fibrillation and previous TIA. EXAM: PORTABLE CHEST 1 VIEW COMPARISON:  Portable chest x-ray of September 04, 2018 FINDINGS: The lungs are well-expanded. There are persistent bibasilar densities consistent with posterior layering pleural fluid collections. There is persistent bibasilar density greatest on the left. The heart is top-normal in size. The pulmonary vascularity is not clearly engorged. There is calcification in the wall of the aortic arch. The endotracheal tube tip projects 2.5 cm above the carina. The esophagogastric tube tip and proximal port project below the GE junction. IMPRESSION: Persistent bilateral pleural effusions layering posteriorly. Bibasilar atelectasis or infiltrates greatest on the left. No overt pulmonary edema. The support tubes are in reasonable position. Thoracic aortic atherosclerosis. Electronically Signed  By: David  Swaziland M.D.   On: 09/07/2018 09:04    EKG:   Orders placed or performed during the hospital  encounter of 08/17/2018  . ED EKG  . ED EKG  . EKG 12-Lead  . EKG 12-Lead  . EKG 12-Lead  . EKG 12-Lead    ASSESSMENT AND PLAN:   85 year old female with past medical history significant for A. fib not on anticoagulation, mild dementia, history of CHF unknown ejection fraction, diabetes and TIA who lives at home with her daughter was brought in secondary to difficulty breathing  *Acute hypoxic respiratory failure Likely due to cardiogenic shock D/W intensivist, status post one-way extubation September 08, 2018, prognosis remains quite poor, palliative care following Echocardiogram noted for EF 30-35%   *Acute CHF exacerbation Echo noted above  *Diabetes mellitus Controlled on current regiment    *Mild dementia Ambulates with minimal assistance at baseline  *Sinus tachycardia Continue amiodarone drip  Palliative care consulted given poor prognosis   All the records are reviewed and case discussed with Care Management/Social Workerr. Management plans discussed with the patient, family and they are in agreement.  CODE STATUS: DNR  TOTAL TIME TAKING CARE OF THIS PATIENT: 35 minutes.   POSSIBLE D/C IN 3-6 DAYS, DEPENDING ON CLINICAL CONDITION.   Evelena Asa Shakoya Gilmore M.D on 09/08/2018 at 3:10 PM  Between 7am to 6pm - Pager - [phone]  After 6pm go to [email or web address] - password [email or web address]  [facility]  Office  [phone]  CC: Primary care physician; Patient, No Pcp Per

## 2018-09-08 DEATH — deceased

## 2018-09-09 DIAGNOSIS — Z9911 Dependence on respirator [ventilator] status: Secondary | ICD-10-CM

## 2018-09-09 LAB — GLUCOSE, CAPILLARY
Glucose-Capillary: 217 mg/dL — ABNORMAL HIGH (ref 70–99)
Glucose-Capillary: 139 mg/dL — ABNORMAL HIGH (ref 70–99)

## 2018-09-09 MED ORDER — MORPHINE 100MG IN NS 100ML (1MG/ML) PREMIX INFUSION
2.0000 mg/h | INTRAVENOUS | Status: DC
  Administered 2018-09-09: 2 mg/h via INTRAVENOUS
  Filled 2018-09-09 (×4): qty 100

## 2018-09-09 MED ORDER — LORAZEPAM 2 MG/ML IJ SOLN
INTRAMUSCULAR | Status: AC
  Filled 2018-09-09: qty 1

## 2018-09-09 MED ORDER — MORPHINE BOLUS VIA INFUSION
2.0000 mg | INTRAVENOUS | Status: DC | PRN
  Administered 2018-09-10: 2 mg via INTRAVENOUS
  Filled 2018-09-09: qty 4

## 2018-09-09 MED ORDER — AMIODARONE HCL IN DEXTROSE 360-4.14 MG/200ML-% IV SOLN: 30.0000 mg/h | INTRAVENOUS | Status: DC

## 2018-09-09 MED ORDER — KETOROLAC TROMETHAMINE 15 MG/ML IJ SOLN: 15.0000 mg | Freq: Four times a day (QID) | INTRAMUSCULAR | Status: DC | PRN

## 2018-09-09 MED ORDER — GLYCOPYRROLATE 0.2 MG/ML IJ SOLN
0.2000 mg | Freq: Three times a day (TID) | INTRAMUSCULAR | Status: DC
  Administered 2018-09-09: 0.2 mg via INTRAVENOUS
  Filled 2018-09-09: qty 1

## 2018-09-09 MED ORDER — LORAZEPAM 2 MG/ML IJ SOLN
1.0000 mg | INTRAMUSCULAR | Status: DC | PRN
  Administered 2018-09-09: 2 mg via INTRAVENOUS

## 2018-09-09 MED ORDER — MORPHINE SULFATE (PF) 2 MG/ML IV SOLN
2.0000 mg | INTRAVENOUS | Status: DC | PRN
  Administered 2018-09-09 (×3): 4 mg via INTRAVENOUS
  Filled 2018-09-09 (×3): qty 2

## 2018-09-09 MED ORDER — LORAZEPAM 2 MG/ML IJ SOLN
1.0000 mg | INTRAMUSCULAR | Status: DC | PRN
  Administered 2018-09-09 (×6): 2 mg via INTRAVENOUS
  Filled 2018-09-09 (×6): qty 1

## 2018-09-09 NOTE — Progress Notes (Signed)
Notified by RN pts family requesting to discuss goals of treatment. Pts daughter and son currently at bedside with additional family members, at this time they have stated they do not want the pt to be comfort measures only.  They are requesting all medications to be restarted in an attempt to see if pt will recover with medical management, they would like the pt to remain a DO NOT RESUSCITATE.  If the pt declines again they would consider transitioning the pt to comfort measures only.  Therefore, all medications reordered per family request.  Pt currently on 2L via nasal canula with O2 sats 95%, respiratory rate 30, and cardiac rhythm atrial fibrillation with rvr on cardiac monitor hr 163 will restart amiodarone gtt.  Will continue to monitor and assess pt.  Sonda Rumble, AGNP  Pulmonary/Critical Care Pager [phone] (please enter 7 digits) PCCM Consult Pager [phone] (please enter 7 digits)

## 2018-09-09 NOTE — Progress Notes (Signed)
Pt's family has refused to allow pt to be taken off of amio gtt, pt cannot be moved to 1C at this time due to same. It has been explained to family that pt is on comfort measures and they continue to refuse discontinuation of same. Dr. Belia HemanKasa aware of same.

## 2018-09-09 NOTE — Progress Notes (Signed)
RT called to patient bedside per family member request for NTS. Patient tolerated NTS well, moderate amount of thick white/tan secretions removed.

## 2018-09-09 NOTE — Progress Notes (Signed)
Follow up - Critical Care Medicine Note  Patient Details:    Dana Griffith is an 85 y.o. female.admitted with Acute Hypoxic Respiratory failure requiring intubation in the ED in the setting of bilateral pleural effusions, and  A-fib with RVR.  Patient noted on echocardiogram to have ejection fraction reduced at 30 to 35%.  diastolic dysfunction and global hypokinesia with pulmonary hypertension.  Lines, Airways, Drains: Airway 7.5 mm (Active)  Secured at (cm) 22 cm 09/06/2018  3:45 AM  Measured From Lips 09/06/2018  3:45 AM  Secured Location Center 09/06/2018  3:45 AM  Secured By [employer] 09/06/2018  3:45 AM  Tube Holder Repositioned Yes 09/06/2018  3:13 AM  Cuff Pressure (cm H2O) 24 cm H2O 09/05/2018  7:13 PM  Site Condition Dry 09/06/2018  3:45 AM     NG/OG Tube Orogastric 18 Fr. Center mouth Xray;Aucultation Documented cm marking at nare/ corner of mouth 51 cm (Active)  Cm Marking at Nare/Corner of Mouth (if applicable) 51 cm 09/06/2018  3:45 AM  Site Assessment Clean;Dry;Intact 09/06/2018  3:45 AM  Ongoing Placement Verification No change in cm markings or external length of tube from initial placement 09/06/2018  3:45 AM  Status Suction-low intermittent 09/06/2018  3:45 AM  Amount of suction 80 mmHg 09/06/2018  3:45 AM  Drainage Appearance Other (Comment) 09/06/2018  3:45 AM  Intake (mL) 60 mL 09/05/2018 11:27 AM  Output (mL) 5 mL 09/03/2018  6:00 PM     Urethral Catheter [name], RN Double-lumen 14 Fr. (Active)  Indication for Insertion or Continuance of Catheter Unstable critical patients (first 24-48 hours) 09/06/2018  3:45 AM  Site Assessment Clean;Intact 09/06/2018  3:45 AM  Catheter Maintenance Bag below level of bladder;Catheter secured;Drainage bag/tubing not touching floor;Insertion date on drainage bag;No dependent loops;Seal intact 09/06/2018  3:45 AM  Collection Container Standard drainage bag 09/06/2018  3:45 AM  Securement Method Securing device  (Describe) 09/06/2018  3:45 AM  Urinary Catheter Interventions Unclamped 09/06/2018  3:45 AM  Input (mL) 150 mL 09/03/2018  4:25 AM  Output (mL) 75 mL 09/06/2018  6:28 AM    Anti-infectives:  Anti-infectives (From admission, onward)   Start     Dose/Rate Route Frequency Ordered Stop   09/04/18 0200  vancomycin (VANCOCIN) IVPB 1000 mg/200 mL premix  Status:  Discontinued     1,000 mg 200 mL/hr over 60 Minutes Intravenous Every 36 hours 09/03/18 0436 09/03/18 1022   09/03/18 1800  cefTRIAXone (ROCEPHIN) 1 g in sodium chloride 0.9 % 100 mL IVPB  Status:  Discontinued     1 g 200 mL/hr over 30 Minutes Intravenous Every 24 hours 09/03/18 1022 09/05/18 1951   09/03/18 0245  ceFEPIme (MAXIPIME) 2 g in sodium chloride 0.9 % 100 mL IVPB  Status:  Discontinued     2 g 200 mL/hr over 30 Minutes Intravenous Every 24 hours 09/03/18 0241 09/03/18 1022   09/03/18 0245  vancomycin (VANCOCIN) IVPB 1000 mg/200 mL premix     1,000 mg 200 mL/hr over 60 Minutes Intravenous  Once 09/03/18 0241 09/03/18 0530   08/26/2018 1900  cefTRIAXone (ROCEPHIN) 1 g in sodium chloride 0.9 % 100 mL IVPB     1 g 200 mL/hr over 30 Minutes Intravenous  Once 08/23/2018 1855 09/06/2018 2013   08/09/2018 1900  azithromycin (ZITHROMAX) 500 mg in sodium chloride 0.9 % 250 mL IVPB     500 mg 250 mL/hr over 60 Minutes Intravenous  Once 09/06/2018 1855 09/07/2018 2134  Microbiology: Results for orders placed or performed during the hospital encounter of 08/13/2018  MRSA PCR Screening     Status: None   Collection Time: 09/03/18  1:59 AM  Result Value Ref Range Status   MRSA by PCR NEGATIVE NEGATIVE Final    Comment:        The GeneXpert MRSA Assay (FDA approved for NASAL specimens only), is one component of a comprehensive MRSA colonization surveillance program. It is not intended to diagnose MRSA infection nor to guide or monitor treatment for MRSA infections. Performed at [facility], [address]  [place], [place] [address]   Culture, respiratory (non-expectorated)     Status: None   Collection Time: 09/03/18  8:58 AM  Result Value Ref Range Status   Specimen Description   Final    TRACHEAL ASPIRATE Performed at [facility], [address] [place], [place] [address]    Special Requests   Final    Normal Performed at [facility], [address] [place], [place] [address]    Gram Stain   Final    MODERATE WBC PRESENT, PREDOMINANTLY PMN RARE SQUAMOUS EPITHELIAL CELLS PRESENT RARE GRAM POSITIVE COCCI    Culture   Final    FEW Consistent with normal respiratory flora. Performed at [facility], [address], [place], [place] [address]    Report Status 09/05/2018 FINAL  Final    Studies: Dg Abd 1 View  Result Date: 09/06/2018 CLINICAL DATA:  Ileus. EXAM: ABDOMEN - 1 VIEW COMPARISON:  08/20/2018 FINDINGS: An enteric tube remains in place with tip in the expected region of the proximal gastric body and side hole likely just below the GE junction. Gas is present in nondilated colon. No dilated bowel loops are seen to suggest obstruction. Surgical clips are present in the right upper abdomen and pelvis. Bilateral pleural effusions are again noted. There is mild thoracolumbar dextroscoliosis. IMPRESSION: Nonobstructed bowel gas pattern. Electronically Signed   By: Sebastian Ache M.D.   On: 09/06/2018 10:20   Ct Chest Wo Contrast  Result Date: 08/09/2018 CLINICAL DATA:  Dyspnea cough since Thanksgiving. EXAM: CT CHEST WITHOUT CONTRAST TECHNIQUE: Multidetector CT imaging of the chest was performed following the standard protocol without IV contrast. COMPARISON:  CXR 09/07/2018 and 09/15/2010 FINDINGS: Cardiovascular: Cardiomegaly with aortic atherosclerosis is noted with left main and three-vessel coronary arteriosclerosis. No pericardial effusion or thickening. The thoracic aorta is nonaneurysmal. Unenhanced pulmonary vasculature is unremarkable.  Mediastinum/Nodes: No thyromegaly or mass. Atherosclerosis of the great vessels. Small subcentimeter short axis mediastinal nodes are noted. Assessment for hilar adenopathy is limited due to lack of IV contrast. Small anterior chest wall hypodense subcutaneous nodule likely representing small sebaceous cyst is noted measuring up to 2.2 cm. Lungs/Pleura: Left greater than right moderate pleural effusions with adjacent compressive atelectasis. Respiratory motion artifacts limit assessment. Subtle nodular density in the left upper lobe measuring 4 mm, series 3/45 is identified. Posterior segment right upper lobe pulmonary consolidation is visualized, series 3/59 with air bronchograms. Atelectasis or pneumonia might account for this appearance. No pneumothorax is seen. Upper Abdomen: No acute abnormality. Musculoskeletal: Thoracic spondylosis withage-indeterminate likely chronic mild inferior endplate compression of T8 and superior endplate of T10. IMPRESSION: 1. Moderate left greater than right pleural effusions with adjacent compressive atelectasis. 2. Posterior segment right upper lobe pulmonary consolidation with air bronchograms may represent atelectasis or pneumonia. 3. 4 mm nodular density in the left upper lobe. No follow-up needed if patient is low-risk. Non-contrast  chest CT can be considered in 12 months if patient is high-risk. This recommendation follows the consensus statement: Guidelines for Management of Incidental Pulmonary Nodules Detected on CT Images: From the Fleischner Society 2017; Radiology 2017; 284:228-243. 4. Thoracic spondylosis. Mild inferior endplate compression of T8 and superior endplate compression T10 likely remote. No retropulsion. Aortic Atherosclerosis (ICD10-I70.0). Electronically Signed   By: Tollie Ethavid  Kwon M.D.   On: 09/01/2018 20:09   Dg Chest Port 1 View  Result Date: 09/07/2018 CLINICAL DATA:  Acute respiratory failure with hypoxia, ventilated patient. History of atrial  fibrillation and previous TIA. EXAM: PORTABLE CHEST 1 VIEW COMPARISON:  Portable chest x-ray of September 04, 2018 FINDINGS: The lungs are well-expanded. There are persistent bibasilar densities consistent with posterior layering pleural fluid collections. There is persistent bibasilar density greatest on the left. The heart is top-normal in size. The pulmonary vascularity is not clearly engorged. There is calcification in the wall of the aortic arch. The endotracheal tube tip projects 2.5 cm above the carina. The esophagogastric tube tip and proximal port project below the GE junction. IMPRESSION: Persistent bilateral pleural effusions layering posteriorly. Bibasilar atelectasis or infiltrates greatest on the left. No overt pulmonary edema. The support tubes are in reasonable position. Thoracic aortic atherosclerosis. Electronically Signed   By: David  SwazilandJordan M.D.   On: 09/07/2018 09:04   Dg Chest Port 1 View  Result Date: 09/04/2018 CLINICAL DATA:  Acute respiratory failure EXAM: PORTABLE CHEST 1 VIEW COMPARISON:  Chest radiograph 09/03/2018 FINDINGS: ET tube terminates in the mid trachea. Enteric tube courses inferior to the diaphragm. Monitoring leads overlie the patient. Stable cardiac and mediastinal contours. Persistent layering bilateral pleural effusions with underlying opacities. No pneumothorax. IMPRESSION: Sport apparatus as above. Layering effusions with underlying opacities. Electronically Signed   By: Annia Beltrew  Davis M.D.   On: 09/04/2018 08:00   Portable Chest Xray  Result Date: 09/03/2018 CLINICAL DATA:  Acute respiratory failure EXAM: PORTABLE CHEST 1 VIEW COMPARISON:  09/02/2017 FINDINGS: Endotracheal tube terminates 2.5 cm above the carina. No frank interstitial edema. Suspected small bilateral pleural effusions, right greater than left. No pneumothorax. Cardiomegaly. Degenerative changes of the thoracic spine. IMPRESSION: Endotracheal tube terminates 2.5 cm above the carina. Suspected small  bilateral pleural effusions, right greater than left. Electronically Signed   By: Charline BillsSriyesh  Krishnan M.D.   On: 09/03/2018 05:48   Dg Chest Portable 1 View  Result Date: 08/19/2018 CLINICAL DATA:  Respiratory failure. Status post intubation. EXAM: PORTABLE CHEST 1 VIEW COMPARISON:  Same day chest CT and CXR FINDINGS: New endotracheal tube tip is identified approximately 3.2 cm above the carina in satisfactory position. Right greater than left pleural effusions with atelectasis and interstitial edema is noted. Aortic atherosclerosis is identified. No acute osseous appearing abnormality. IMPRESSION: 1. Satisfactory endotracheal tube position. 2. Stable cardiomegaly and aortic atherosclerosis. 3. Right greater than left pleural effusions with atelectasis and interstitial edema. Electronically Signed   By: Tollie Ethavid  Kwon M.D.   On: 08/28/2018 21:58   Dg Chest Portable 1 View  Result Date: 08/10/2018 CLINICAL DATA:  Hypoxia, shortness of Breath EXAM: PORTABLE CHEST 1 VIEW COMPARISON:  07/17/2017 FINDINGS: Cardiomegaly with vascular congestion. Moderate bilateral pleural effusions with bibasilar atelectasis or infiltrates. No acute bony abnormality. IMPRESSION: Cardiomegaly with vascular congestion. Moderate effusions with bibasilar atelectasis or pneumonia. Electronically Signed   By: Charlett NoseKevin  Dover M.D.   On: 08/15/2018 18:14   Dg Abd Portable 1 View  Result Date: 08/11/2018 CLINICAL DATA:  Check gastric catheter placement EXAM: PORTABLE  ABDOMEN - 1 VIEW COMPARISON:  Chest x-ray from earlier in the same day. FINDINGS: Gastric catheter is noted within the stomach. Bilateral pleural effusions are again noted similar to that seen on recent chest x-ray. Degenerative changes of the lumbar spine is noted. IMPRESSION: Gastric catheter within the stomach. Bilateral pleural effusions. Electronically Signed   By: Alcide CleverMark  Lukens M.D.   On: 08/23/2018 23:46    Subjective:    Overnight Issues: Patient was extubated  yesterday evening.  Goals of care outlined to give patient a chance to see if she will tolerate being extubated but if she deteriorates to transition more towards comfort.  At the present time patient is not responsive, has labored breathing  Objective:  Vital signs for last 24 hours: Temp:  [97.9 F (36.6 C)] 97.9 F (36.6 C) (01/02 0000) Pulse Rate:  [30-118] 108 (01/02 0500) Resp:  [18-38] 28 (01/02 0500) BP: (85-145)/(63-114) 100/66 (01/02 0500) SpO2:  [95 %-100 %] 95 % (01/02 0500) FiO2 (%):  [24 %] 24 % (01/01 1658)  Hemodynamic parameters for last 24 hours:    Intake/Output from previous day: 01/01 0701 - 01/02 0700 In: 846 [I.V.:566; NG/GT:280] Out: 325 [Urine:325]  Intake/Output this shift: No intake/output data recorded.  Vent settings for last 24 hours: Vent Mode: PRVC FiO2 (%):  [24 %] 24 % Set Rate:  [16 bmp] 16 bmp Vt Set:  [400 mL] 400 mL PEEP:  [5 cmH20] 5 cmH20  Physical Exam:  General: Frail, chronically ill appearing female, laying in bed, intubated, sedated, thin. Neuro:Patient is not responding at this time  HEENT:Patient was extubated yesterday, on nasal cannula, labored breathing, distended neck veins, trachea midline Cardiovascular:Irregulary irregular rhythm, rate is poorly controlled on amiodarone infusion with a ventricular sponsor 130  lungs:  Coarse diffuse expiratory rhonchi noted Abdomen:Soft, nontender, nondistended, no grimacing when palpated no guarding, BS+ x4 Musculoskeletal:No deformities, normal bulk and tone, 2+ edema bilateral LE Skin:Warm/dry. No obvious rashes, lesions, or ulcerations  Assessment/Plan:  Acute hypoxic respiratory failure, ventilator dependent caused by decompensation of systolic heart failure and A. fib with RVR: She was extubated yesterday after goals of care were outlined with family.  At this point she is not comfort care.  She has had some deterioration in terms of mental status and responsiveness.   We will continue to work with family  Cardiomyopathy likely ischemic:  Ejection fraction of 30 to 35%, etiology both ischemic and valvular Atrial fibrillation with RVR:  Patient is on amiodarone infusion  Bilateral pleural effusions: These are due to decompensated heart failure, she has been difficult to diurese due to labile blood pressures.    Diabetes mellitus with hyperglycemia: Continue to monitor CBGs, she is on sliding scale insulin, continue ICU hypo-/hyperglycemic protocol.  Tora KindredJohn Xyla Leisner, DO  Tully Burgo 09/09/2018  *Care during the described time interval was provided by me and/or other providers on the critical care team.  I have reviewed this patient's available data, including medical history, events of note, physical examination and test results as part of my evaluation. Patient ID: Dana Griffith, female   DOB: 11-07-1921, 85 y.o.   MRN: [id number] Patient ID: Dana Griffith, female   DOB: 11-07-1921, 85 y.o.   MRN: [id number] Patient ID: Dana Griffith, female   DOB: 11-07-1921, 85 y.o.   MRN: [id number]

## 2018-09-09 NOTE — Progress Notes (Signed)
Nutrition Brief Follow-Up Note  Chart reviewed and patient discussed on rounds. Patient now transitioning to comfort care.   No further nutrition interventions warranted at this time. Please re-consult RD as needed.   Selyna Klahn, MS, RD, LDN Office: [phone] Pager: [phone] After Hours/Weekend Pager: [phone]  

## 2018-09-09 NOTE — Progress Notes (Signed)
RT called to patient bedside by RN per family member request. Family requesting breathing treatment. Patient's heart rate was approximately 139bpm. Spoke to NP, PRN medication changed from Duoneb to Xopenex, and treatment given. Patient tolerated well, heart rate did not increase. RN at bedside. Patient placed back on 2L [place].

## 2018-09-09 NOTE — Progress Notes (Signed)
Palliative:  I met today with Ms. Deeb and her granddaughter at bedside. Ms. Corriveau was extubated and there was confusion regarding comfort care vs aggressiveness of care overnight. Ms. Brents is very much distressed with labored breathing. I discussed with granddaughter who agrees that Ms. Broecker is declining, she is unresponsive, and agrees to more aggressive use of comfort medications recognizing prognosis is likely hours. We d/c all diagnostics, CBGs, and all medications that are not providing comfort. They would like to continue amiodarone as they feel this will add to comfort by providing heart rate control. I also provided education regarding use of morphine infusion and how this could likely provide better relief of symptoms at EOL. Discussed plan with Dr. Jefferson Fuel and RN Gabriel Cirri.   I continued to check on Ms. Mcwhirt and I did provide morphine infusion to better provide comfort and management of symptoms. Family are gathering at bedside as her QRS complex is widening and she is progressing towards EOL. Family at bedside understand that she is actively dying and prognosis likely hours and I anticipate she may pass today or tonight. They are calling other family members to bedside given her progression.   Exam: Unresponsive. Agonal breathes. Very labored upon my first visit. + rhonchi. Clammy, diaphoretic.   Plan: - Comfort care; anticipate hospital death.  - Comfort medications provided and morphine infusion initiated. PRN lorazepam, toradol, morphine bolus via infusion. Robinul scheduled.  - Family will benefit from continued emotional support. I have paged chaplain.   39 min  Vinie Sill, NP Palliative Medicine Team Pager # [phone] (M-F 8a-5p) Team Phone # [phone] (Nights/Weekends)

## 2018-09-14 ENCOUNTER — Telehealth: Payer: Self-pay

## 2018-09-14 NOTE — Telephone Encounter (Signed)
Death Certificate received, placed in DK's folder for completion.

## 2018-09-15 NOTE — Telephone Encounter (Signed)
Death certificate complete. [facility] [place] aware ready to be picked up.

## 2018-10-09 NOTE — Progress Notes (Signed)
Patient with no spontanous respirations no auidble heart sounds and no palpable pulse patient prounced death  At 01.01am [facility] was called by Dana Griffith was denied.

## 2018-10-09 NOTE — Death Summary Note (Signed)
DEATH SUMMARY   Patient Details  Name: Dana Griffith MRN: [id number] DOB: May 04, 1922  Admission/Discharge Information   Admit Date:  2018-09-08  Date of Death:  Sep 16, 2018  Time of Death:  0101  Length of Stay: 8  Referring Physician: Patient, No Pcp Per   Reason(s) for Hospitalization  Acute Hypoxic Respiratory Failure secondary to CAP and Pleural Effusion Ischemic Cardiomyopathy Atrial Fibrillation with RVR Diabetes Mellitus with Hyperglycemia  Diagnoses  Preliminary cause of death:  Secondary Diagnoses (including complications and co-morbidities):  Principal Problem:   Acute respiratory failure with hypoxia ([place]) Active Problems:   CAP (community acquired pneumonia)   Pleural effusion   Atrial fibrillation with RVR ([place])   HLD (hyperlipidemia)   Diabetes ([place])   Dementia ([place])   Palliative care encounter   Malnutrition of moderate degree   Ventilator dependence [facility])   Brief Hospital Course (including significant findings, care, treatment, and services provided and events leading to death)  Dana Griffith is a 85 y.o. year old female who was admitted with Acute Hypoxic Respiratory failure requiring intubation in the ED in the setting of CAP, bilateral pleural effusions, and A-fib with RVR.  Patient noted on echocardiogram to have ejection fraction reduced at 30 to 35%.  diastolic dysfunction and global hypokinesia with pulmonary hypertension.  Pt with ventilator dependent respiratory failure, was extubated on 09/08/18 with plan to transition to comfort measures is she were to deteriorate after extubation. On 09/09/18 she was declining as evidenced by labored/agonal respirations and unresponsiveness.  Palliative care met with pt's family where decision was made to transition to full comfort measures.  She was placed on morphine infusion for comfort.  Pt expired on 09-16-2018 @ 0101.    Pertinent Labs and Studies  Significant Diagnostic Studies Dg Abd 1 View  Result Date:  09/06/2018 CLINICAL DATA:  Ileus. EXAM: ABDOMEN - 1 VIEW COMPARISON:  2018-09-08 FINDINGS: An enteric tube remains in place with tip in the expected region of the proximal gastric body and side hole likely just below the GE junction. Gas is present in nondilated colon. No dilated bowel loops are seen to suggest obstruction. Surgical clips are present in the right upper abdomen and pelvis. Bilateral pleural effusions are again noted. There is mild thoracolumbar dextroscoliosis. IMPRESSION: Nonobstructed bowel gas pattern. Electronically Signed   By: Logan Bores M.D.   On: 09/06/2018 10:20   Ct Chest Wo Contrast  Result Date: 09/08/18 CLINICAL DATA:  Dyspnea cough since Thanksgiving. EXAM: CT CHEST WITHOUT CONTRAST TECHNIQUE: Multidetector CT imaging of the chest was performed following the standard protocol without IV contrast. COMPARISON:  CXR 09-08-2018 and 09/15/2010 FINDINGS: Cardiovascular: Cardiomegaly with aortic atherosclerosis is noted with left main and three-vessel coronary arteriosclerosis. No pericardial effusion or thickening. The thoracic aorta is nonaneurysmal. Unenhanced pulmonary vasculature is unremarkable. Mediastinum/Nodes: No thyromegaly or mass. Atherosclerosis of the great vessels. Small subcentimeter short axis mediastinal nodes are noted. Assessment for hilar adenopathy is limited due to lack of IV contrast. Small anterior chest wall hypodense subcutaneous nodule likely representing small sebaceous cyst is noted measuring up to 2.2 cm. Lungs/Pleura: Left greater than right moderate pleural effusions with adjacent compressive atelectasis. Respiratory motion artifacts limit assessment. Subtle nodular density in the left upper lobe measuring 4 mm, series 3/45 is identified. Posterior segment right upper lobe pulmonary consolidation is visualized, series 3/59 with air bronchograms. Atelectasis or pneumonia might account for this appearance. No pneumothorax is seen. Upper Abdomen: No  acute abnormality. Musculoskeletal: Thoracic spondylosis withage-indeterminate likely  chronic mild inferior endplate compression of T8 and superior endplate of S01. IMPRESSION: 1. Moderate left greater than right pleural effusions with adjacent compressive atelectasis. 2. Posterior segment right upper lobe pulmonary consolidation with air bronchograms may represent atelectasis or pneumonia. 3. 4 mm nodular density in the left upper lobe. No follow-up needed if patient is low-risk. Non-contrast chest CT can be considered in 12 months if patient is high-risk. This recommendation follows the consensus statement: Guidelines for Management of Incidental Pulmonary Nodules Detected on CT Images: From the Fleischner Society 2017; Radiology 2017; 284:228-243. 4. Thoracic spondylosis. Mild inferior endplate compression of T8 and superior endplate compression U93 likely remote. No retropulsion. Aortic Atherosclerosis (ICD10-I70.0). Electronically Signed   By: Ashley Royalty M.D.   On: 08/26/2018 20:09   Dg Chest Port 1 View  Result Date: 09/07/2018 CLINICAL DATA:  Acute respiratory failure with hypoxia, ventilated patient. History of atrial fibrillation and previous TIA. EXAM: PORTABLE CHEST 1 VIEW COMPARISON:  Portable chest x-ray of September 04, 2018 FINDINGS: The lungs are well-expanded. There are persistent bibasilar densities consistent with posterior layering pleural fluid collections. There is persistent bibasilar density greatest on the left. The heart is top-normal in size. The pulmonary vascularity is not clearly engorged. There is calcification in the wall of the aortic arch. The endotracheal tube tip projects 2.5 cm above the carina. The esophagogastric tube tip and proximal port project below the GE junction. IMPRESSION: Persistent bilateral pleural effusions layering posteriorly. Bibasilar atelectasis or infiltrates greatest on the left. No overt pulmonary edema. The support tubes are in reasonable position.  Thoracic aortic atherosclerosis. Electronically Signed   By: David  Martinique M.D.   On: 09/07/2018 09:04   Dg Chest Port 1 View  Result Date: 09/04/2018 CLINICAL DATA:  Acute respiratory failure EXAM: PORTABLE CHEST 1 VIEW COMPARISON:  Chest radiograph 09/03/2018 FINDINGS: ET tube terminates in the mid trachea. Enteric tube courses inferior to the diaphragm. Monitoring leads overlie the patient. Stable cardiac and mediastinal contours. Persistent layering bilateral pleural effusions with underlying opacities. No pneumothorax. IMPRESSION: Sport apparatus as above. Layering effusions with underlying opacities. Electronically Signed   By: Lovey Newcomer M.D.   On: 09/04/2018 08:00   Portable Chest Xray  Result Date: 09/03/2018 CLINICAL DATA:  Acute respiratory failure EXAM: PORTABLE CHEST 1 VIEW COMPARISON:  09/02/2017 FINDINGS: Endotracheal tube terminates 2.5 cm above the carina. No frank interstitial edema. Suspected small bilateral pleural effusions, right greater than left. No pneumothorax. Cardiomegaly. Degenerative changes of the thoracic spine. IMPRESSION: Endotracheal tube terminates 2.5 cm above the carina. Suspected small bilateral pleural effusions, right greater than left. Electronically Signed   By: Julian Hy M.D.   On: 09/03/2018 05:48   Dg Chest Portable 1 View  Result Date: 08/11/2018 CLINICAL DATA:  Respiratory failure. Status post intubation. EXAM: PORTABLE CHEST 1 VIEW COMPARISON:  Same day chest CT and CXR FINDINGS: New endotracheal tube tip is identified approximately 3.2 cm above the carina in satisfactory position. Right greater than left pleural effusions with atelectasis and interstitial edema is noted. Aortic atherosclerosis is identified. No acute osseous appearing abnormality. IMPRESSION: 1. Satisfactory endotracheal tube position. 2. Stable cardiomegaly and aortic atherosclerosis. 3. Right greater than left pleural effusions with atelectasis and interstitial edema.  Electronically Signed   By: Ashley Royalty M.D.   On: 09/07/2018 21:58   Dg Chest Portable 1 View  Result Date: 08/19/2018 CLINICAL DATA:  Hypoxia, shortness of Breath EXAM: PORTABLE CHEST 1 VIEW COMPARISON:  07/17/2017 FINDINGS: Cardiomegaly with vascular congestion.  Moderate bilateral pleural effusions with bibasilar atelectasis or infiltrates. No acute bony abnormality. IMPRESSION: Cardiomegaly with vascular congestion. Moderate effusions with bibasilar atelectasis or pneumonia. Electronically Signed   By: Rolm Baptise M.D.   On: 08/23/2018 18:14   Dg Abd Portable 1 View  Result Date: 08/09/2018 CLINICAL DATA:  Check gastric catheter placement EXAM: PORTABLE ABDOMEN - 1 VIEW COMPARISON:  Chest x-ray from earlier in the same day. FINDINGS: Gastric catheter is noted within the stomach. Bilateral pleural effusions are again noted similar to that seen on recent chest x-ray. Degenerative changes of the lumbar spine is noted. IMPRESSION: Gastric catheter within the stomach. Bilateral pleural effusions. Electronically Signed   By: Inez Catalina M.D.   On: 08/19/2018 23:46    Microbiology Recent Results (from the past 240 hour(s))  MRSA PCR Screening     Status: None   Collection Time: 09/03/18  1:59 AM  Result Value Ref Range Status   MRSA by PCR NEGATIVE NEGATIVE Final    Comment:        The GeneXpert MRSA Assay (FDA approved for NASAL specimens only), is one component of a comprehensive MRSA colonization surveillance program. It is not intended to diagnose MRSA infection nor to guide or monitor treatment for MRSA infections. Performed at [facility], [address] [place], [place] [address]   Culture, respiratory (non-expectorated)     Status: None   Collection Time: 09/03/18  8:58 AM  Result Value Ref Range Status   Specimen Description   Final    TRACHEAL ASPIRATE Performed at [facility], [address] [place], [place] [address]    Special Requests   Final     Normal Performed at [facility], [place], [place] [address]    Gram Stain   Final    MODERATE WBC PRESENT, PREDOMINANTLY PMN RARE SQUAMOUS EPITHELIAL CELLS PRESENT RARE GRAM POSITIVE COCCI    Culture   Final    FEW Consistent with normal respiratory flora. Performed at [facility], [place] [address], [place], [place] [address]    Report Status 09/05/2018 FINAL  Final    Lab Basic Metabolic Panel: Recent Labs  Lab 09/03/18 2137 09/04/18 0550 09/05/18 0318 09/05/18 0408 09/06/18 0424 09/07/18 0341 09/08/18 0525  NA 137 136 139  --  138 138 137  K 4.2 4.2 3.9  --  3.9 4.2 4.0  CL 106 105 105  --  107 107 106  CO2 _0 --  19* 22 21*  GLUCOSE 161* 122* 103*  --  101* 191* 290*  BUN 23 24* 24*  --  23 24* 37*  CREATININE 0.91 1.15* 1.03*  --  0.88 0.82 0.98  CALCIUM 8.6* 8.7* 8.3*  --  8.4* 8.5* 8.6*  MG 1.9  --   --  1.8 1.7 2.1 2.1  PHOS 4.4  --   --  3.6  --   --  2.8   Liver Function Tests: Recent Labs  Lab 09/03/18 2137  AST 19  ALT 12  ALKPHOS 101  BILITOT 0.4  PROT 5.7*  ALBUMIN 2.7*   No results for input(s): LIPASE, AMYLASE in the last 168 hours. No results for input(s): AMMONIA in the last 168 hours. CBC: Recent Labs  Lab 09/04/18 0550 09/07/18 0341 09/08/18 0525  WBC 21.2* 10.5 12.9*  NEUTROABS  --  8.9*  --   HGB 13.8 13.0 12.5  HCT 44.5 41.0 40.3  MCV 93.5 91.9  92.6  PLT 261 206 170   Cardiac Enzymes: Recent Labs  Lab 09/05/18 0408  TROPONINI 0.06*   Sepsis Labs: Recent Labs  Lab 09/03/18 2141 09/04/18 0550 09/07/18 0341 09/08/18 0525  PROCALCITON  --  <0.10  --   --   WBC  --  21.2* 10.5 12.9*  LATICACIDVEN 1.3  --   --   --     Procedures/Operations  08/26/2018>> Intubation in ED    Darel Hong, [facility] Pager: [phone] Cell: [place] 09/20/2018, 8:10 AM

## 2018-10-09 DEATH — deceased

## 2020-02-17 IMAGING — DX DG CHEST 1V PORT
1 series · 1 of 1 positions shown · non-contrast
Comparison: 07/17/2017

CLINICAL DATA: Hypoxia, shortness of Breath

EXAM:
PORTABLE CHEST 1 VIEW

[chest ap]
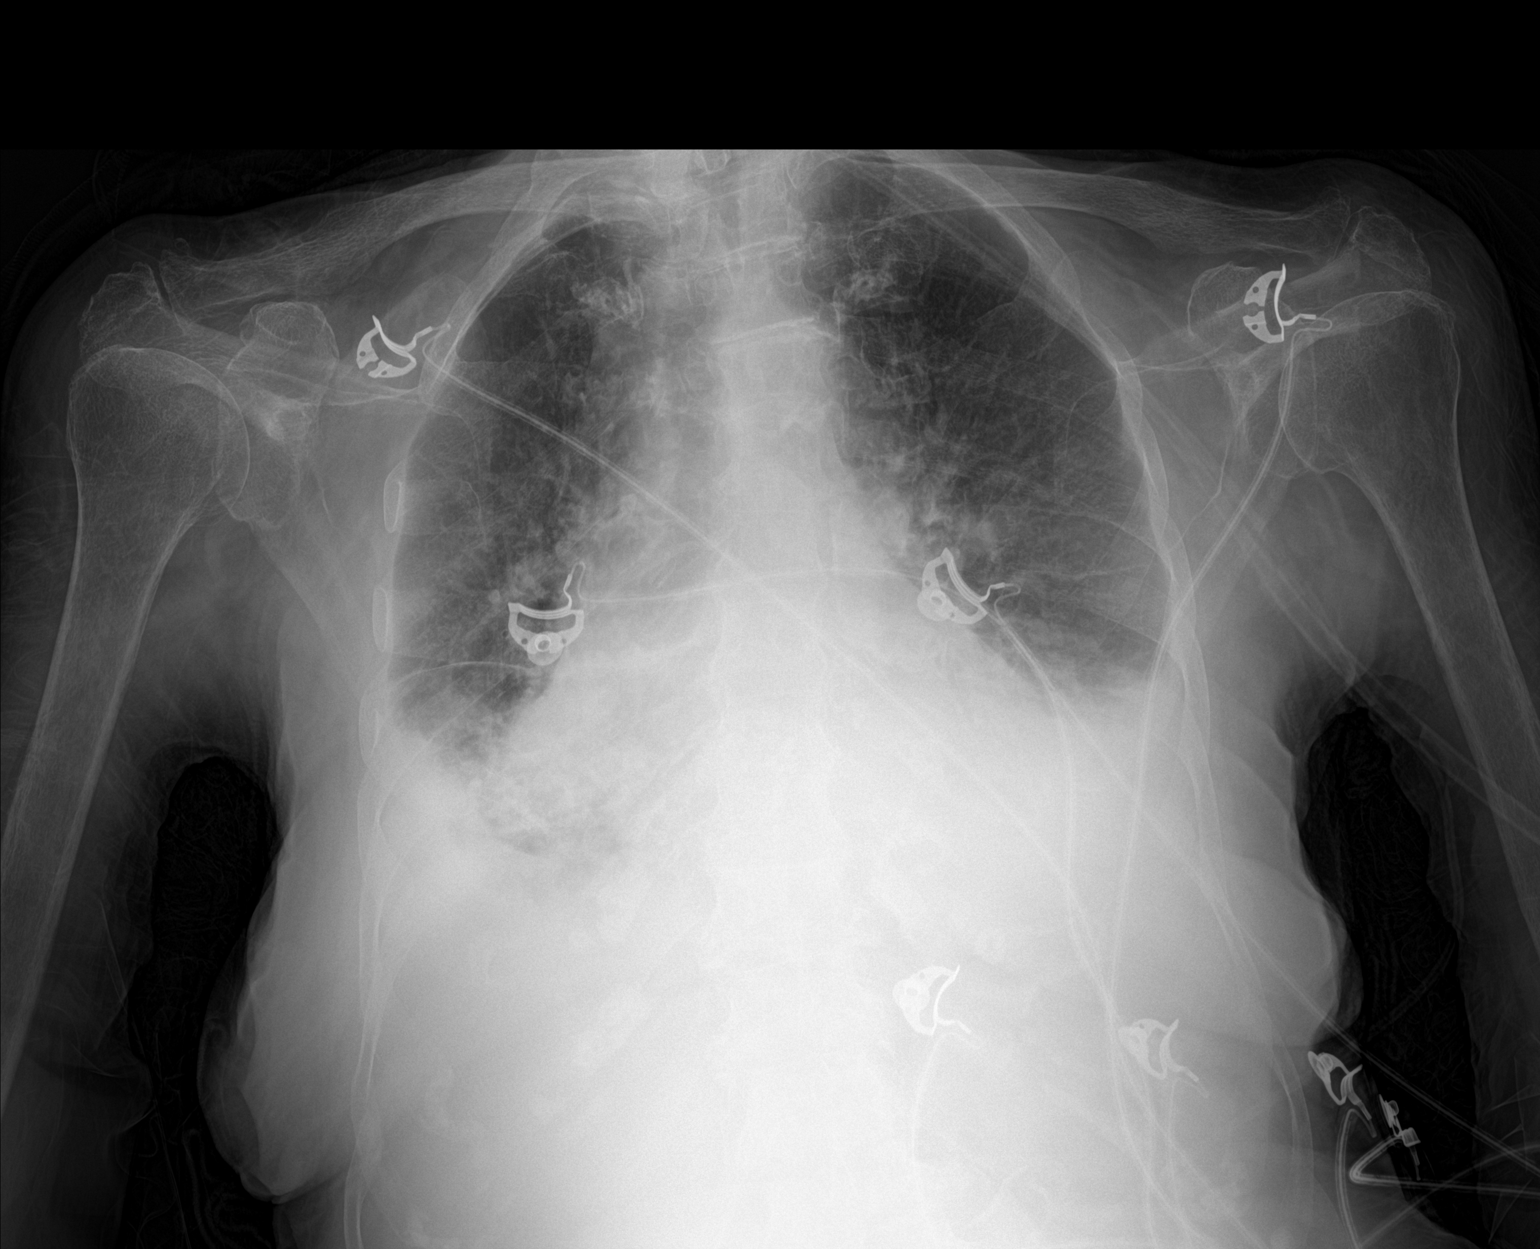

[1 of 1 positions shown; findings below may reference images not displayed]

FINDINGS: Cardiomegaly with vascular congestion. Moderate bilateral pleural
effusions with bibasilar atelectasis or infiltrates. No acute bony
abnormality.
IMPRESSION: Cardiomegaly with vascular congestion.

Moderate effusions with bibasilar atelectasis or pneumonia.

## 2020-02-22 IMAGING — DX DG CHEST 1V PORT
1 series · 1 of 1 positions shown · non-contrast
Comparison: Portable chest x-ray September 04, 2018

CLINICAL DATA: Acute respiratory failure with hypoxia, ventilated
patient. History of atrial fibrillation and previous TIA.

EXAM:
PORTABLE CHEST 1 VIEW

[chest ap]
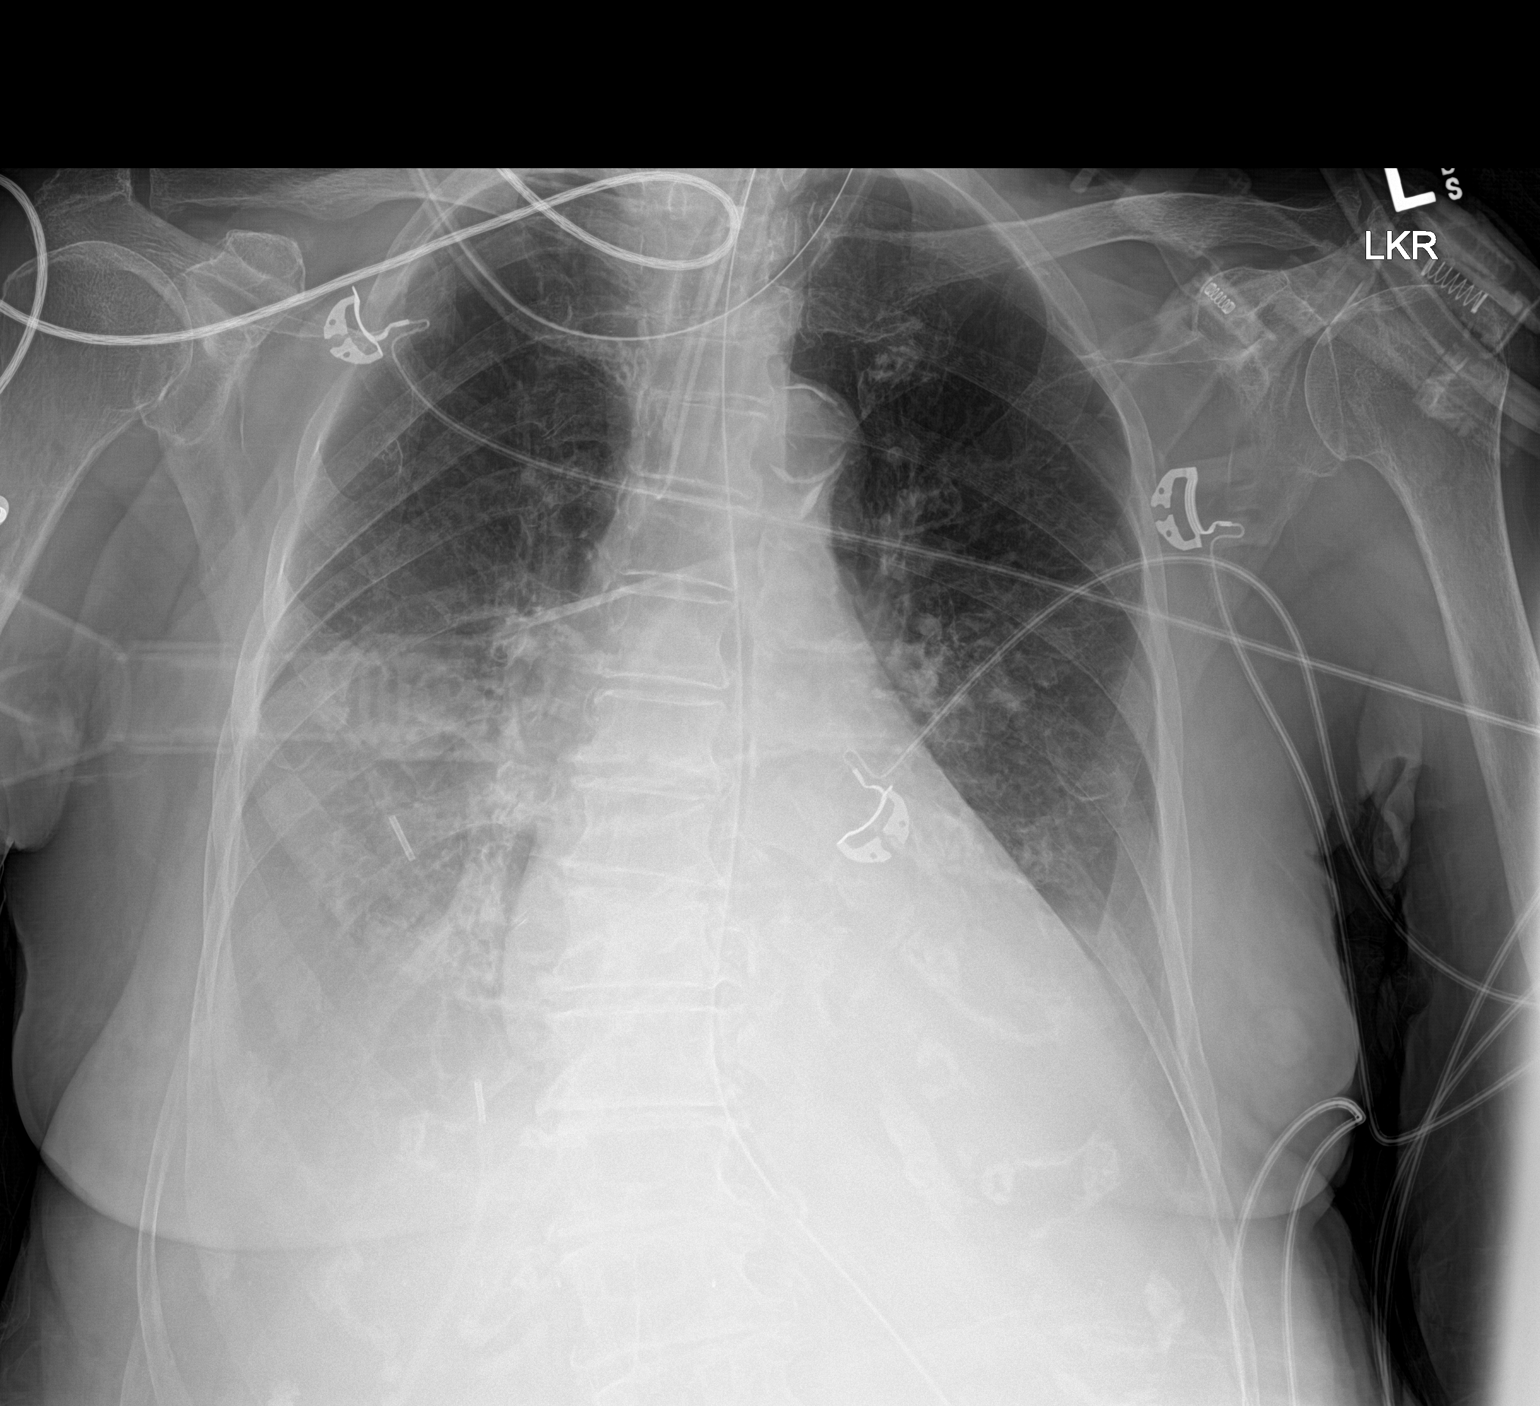

[1 of 1 positions shown; findings below may reference images not displayed]

FINDINGS: The lungs are well-expanded. There are persistent bibasilar
densities consistent with posterior layering pleural fluid
collections. There is persistent bibasilar density greatest on the
left. The heart is top-normal in size. The pulmonary vascularity is
not clearly engorged. There is calcification in the wall of the
aortic arch. The endotracheal tube tip projects 2.5 cm above the
carina. The esophagogastric tube tip and proximal port project below
the GE junction.
IMPRESSION: Persistent bilateral pleural effusions layering posteriorly.
Bibasilar atelectasis or infiltrates greatest on the left. No overt
pulmonary edema.

The support tubes are in reasonable position.

Thoracic aortic atherosclerosis.
